# Patient Record
Sex: Female | Born: 1950
Health system: Southern US, Community
[De-identification: ages and names within clinical notes are randomized; demographics above are authoritative.]

## PROBLEM LIST (undated history)

## (undated) DIAGNOSIS — I10 Essential (primary) hypertension: Secondary | ICD-10-CM

## (undated) DIAGNOSIS — J45909 Unspecified asthma, uncomplicated: Secondary | ICD-10-CM

---

## 2020-04-18 ENCOUNTER — Inpatient Hospital Stay (HOSPITAL_COMMUNITY)
Admission: EM | Admit: 2020-04-18 | Discharge: 2020-04-25 | DRG: 177 | Disposition: A | Discharge status: Home / Self Care | Payer: HRSA Program | Attending: Internal Medicine | Admitting: Internal Medicine

## 2020-04-18 ENCOUNTER — Emergency Department (HOSPITAL_COMMUNITY): Payer: HRSA Program

## 2020-04-18 ENCOUNTER — Other Ambulatory Visit: Payer: Self-pay

## 2020-04-18 ENCOUNTER — Encounter (HOSPITAL_COMMUNITY): Payer: Self-pay

## 2020-04-18 DIAGNOSIS — I82412 Acute embolism and thrombosis of left femoral vein: Secondary | ICD-10-CM | POA: Diagnosis present

## 2020-04-18 DIAGNOSIS — U071 COVID-19: Principal | ICD-10-CM | POA: Diagnosis present

## 2020-04-18 DIAGNOSIS — I110 Hypertensive heart disease with heart failure: Secondary | ICD-10-CM | POA: Diagnosis present

## 2020-04-18 DIAGNOSIS — J9602 Acute respiratory failure with hypercapnia: Secondary | ICD-10-CM | POA: Diagnosis present

## 2020-04-18 DIAGNOSIS — J1282 Pneumonia due to coronavirus disease 2019: Secondary | ICD-10-CM | POA: Diagnosis present

## 2020-04-18 DIAGNOSIS — N170 Acute kidney failure with tubular necrosis: Secondary | ICD-10-CM | POA: Diagnosis present

## 2020-04-18 DIAGNOSIS — J45909 Unspecified asthma, uncomplicated: Secondary | ICD-10-CM | POA: Diagnosis present

## 2020-04-18 DIAGNOSIS — I82432 Acute embolism and thrombosis of left popliteal vein: Secondary | ICD-10-CM | POA: Diagnosis present

## 2020-04-18 DIAGNOSIS — J9601 Acute respiratory failure with hypoxia: Secondary | ICD-10-CM | POA: Diagnosis present

## 2020-04-18 DIAGNOSIS — I248 Other forms of acute ischemic heart disease: Secondary | ICD-10-CM | POA: Diagnosis present

## 2020-04-18 DIAGNOSIS — E1165 Type 2 diabetes mellitus with hyperglycemia: Secondary | ICD-10-CM | POA: Diagnosis present

## 2020-04-18 DIAGNOSIS — G9341 Metabolic encephalopathy: Secondary | ICD-10-CM | POA: Diagnosis present

## 2020-04-18 DIAGNOSIS — I82442 Acute embolism and thrombosis of left tibial vein: Secondary | ICD-10-CM | POA: Diagnosis present

## 2020-04-18 DIAGNOSIS — R0902 Hypoxemia: Secondary | ICD-10-CM

## 2020-04-18 DIAGNOSIS — Z6841 Body Mass Index (BMI) 40.0 and over, adult: Secondary | ICD-10-CM

## 2020-04-18 DIAGNOSIS — E785 Hyperlipidemia, unspecified: Secondary | ICD-10-CM | POA: Diagnosis present

## 2020-04-18 DIAGNOSIS — E662 Morbid (severe) obesity with alveolar hypoventilation: Secondary | ICD-10-CM | POA: Diagnosis present

## 2020-04-18 DIAGNOSIS — I5043 Acute on chronic combined systolic (congestive) and diastolic (congestive) heart failure: Secondary | ICD-10-CM | POA: Diagnosis present

## 2020-04-18 DIAGNOSIS — I82452 Acute embolism and thrombosis of left peroneal vein: Secondary | ICD-10-CM | POA: Diagnosis present

## 2020-04-18 DIAGNOSIS — J9600 Acute respiratory failure, unspecified whether with hypoxia or hypercapnia: Secondary | ICD-10-CM | POA: Diagnosis present

## 2020-04-18 DIAGNOSIS — J9690 Respiratory failure, unspecified, unspecified whether with hypoxia or hypercapnia: Secondary | ICD-10-CM | POA: Diagnosis present

## 2020-04-18 HISTORY — DX: Essential (primary) hypertension: I10

## 2020-04-18 HISTORY — DX: Unspecified asthma, uncomplicated: J45.909

## 2020-04-18 LAB — CBC WITH DIFFERENTIAL/PLATELET
Abs Immature Granulocytes: 0.12 10*3/uL — ABNORMAL HIGH (ref 0.00–0.07)
Basophils Absolute: 0 10*3/uL (ref 0.0–0.1)
Basophils Relative: 1 %
Eosinophils Absolute: 0 10*3/uL (ref 0.0–0.5)
Eosinophils Relative: 0 %
HCT: 48 % — ABNORMAL HIGH (ref 36.0–46.0)
Hemoglobin: 14.2 g/dL (ref 12.0–15.0)
Immature Granulocytes: 2 %
Lymphocytes Relative: 25 %
Lymphs Abs: 2 10*3/uL (ref 0.7–4.0)
MCH: 23.1 pg — ABNORMAL LOW (ref 26.0–34.0)
MCHC: 29.6 g/dL — ABNORMAL LOW (ref 30.0–36.0)
MCV: 77.9 fL — ABNORMAL LOW (ref 80.0–100.0)
Monocytes Absolute: 0.6 10*3/uL (ref 0.1–1.0)
Monocytes Relative: 7 %
Neutro Abs: 5.3 10*3/uL (ref 1.7–7.7)
Neutrophils Relative %: 65 %
Platelets: 238 10*3/uL (ref 150–400)
RBC: 6.16 MIL/uL — ABNORMAL HIGH (ref 3.87–5.11)
RDW: 17.2 % — ABNORMAL HIGH (ref 11.5–15.5)
WBC: 8.1 10*3/uL (ref 4.0–10.5)
nRBC: 0.2 % (ref 0.0–0.2)

## 2020-04-18 MED ORDER — SODIUM CHLORIDE 0.9 % IV SOLN: 500.0000 mg | Freq: Once | INTRAVENOUS | Status: DC

## 2020-04-18 NOTE — ED Triage Notes (Signed)
Patient from home with [facility], daughter called for shortness of breath, she gave patient 3 nebs at home with no relief. Patient found to be 67% on RA, now on 15L NRB sat 100%. Report she is vaccinated.

## 2020-04-18 NOTE — ED Provider Notes (Signed)
[facility] EMERGENCY DEPARTMENT Provider Note   CSN: [id number] Arrival date & time: 04/18/20  2253     History Chief Complaint  Patient presents with  . Shortness of Breath    Chelsea Norton is a 70 y.o. female.  70 year old female with medical problems documented below who presents to the emergency department today secondary to dyspnea.  Patient states she has been feeling bad for about a week or so.  She had fever, cough, malaise and just generally feeling unwell.  She was vaccinated against COVID with her second shot being in August 2021.  No known sick contacts.  Did not get booster.  Objective fevers at home.  Apparently was 67% on room air is more comfortable with her nonrebreather on.   Shortness of Breath      Past Medical History:  Diagnosis Date  . Asthma   . Hypertension     There are no problems to display for this patient.    OB History   No obstetric history on file.     No family history on file.     Home Medications Prior to Admission medications   Not on File    Allergies    Patient has no known allergies.  Review of Systems   Review of Systems  Respiratory: Positive for shortness of breath.   All other systems reviewed and are negative.   Physical Exam Updated Vital Signs BP (!) 202/106 (BP Location: Left Arm)   Pulse (!) 122   Temp (!) 102.6 F (39.2 C) (Oral)   Resp (!) 32   Ht 5\' 4"  (1.626 m)   SpO2 100%   Physical Exam Vitals and nursing note reviewed.  Constitutional:      Appearance: She is well-developed and well-nourished.  HENT:     Head: Normocephalic and atraumatic.     Mouth/Throat:     Mouth: Mucous membranes are moist.  Cardiovascular:     Rate and Rhythm: Regular rhythm. Tachycardia present.  Pulmonary:     Effort: Tachypnea present. No respiratory distress.     Breath sounds: No stridor. Decreased breath sounds present. No wheezing or rhonchi.  Abdominal:     General: There is no  distension.     Palpations: Abdomen is soft.  Musculoskeletal:     Cervical back: Normal range of motion.     Right lower leg: Edema present.     Left lower leg: Edema present.  Skin:    General: Skin is warm and dry.     Coloration: Skin is not jaundiced or pale.  Neurological:     General: No focal deficit present.     Mental Status: She is alert.     ED Results / Procedures / Treatments   Labs (all labs ordered are listed, but only abnormal results are displayed) Labs Reviewed  CULTURE, BLOOD (ROUTINE X 2)  CULTURE, BLOOD (ROUTINE X 2)  RESP PANEL BY RT-PCR (FLU A&B, COVID) ARPGX2  COMPREHENSIVE METABOLIC PANEL  LACTIC ACID, PLASMA  LACTIC ACID, PLASMA  CBC WITH DIFFERENTIAL/PLATELET  PROTIME-INR  URINALYSIS, ROUTINE W REFLEX MICROSCOPIC  BRAIN NATRIURETIC PEPTIDE  TROPONIN I (HIGH SENSITIVITY)    EKG None  Radiology DG Chest 2 View  Result Date: 04/18/2020 CLINICAL DATA:  Shortness of breath. EXAM: CHEST - 2 VIEW COMPARISON:  None. FINDINGS: Moderate to marked severity patchy infiltrates are seen within the bilateral lung bases and mid lung fields, bilaterally. There is no evidence of a pleural effusion or  pneumothorax. The cardiac silhouette is markedly enlarged. Widening of the mediastinum is also seen which may be positional in nature. Degenerative changes are seen throughout the thoracic spine. IMPRESSION: 1. Cardiomegaly with moderate to marked severity bilateral infiltrates. Electronically Signed   By: Aram Candela M.D.   On: 04/18/2020 23:32    Procedures .Critical Care Performed by: Marily Memos, MD Authorized by: Marily Memos, MD   Critical care provider statement:    Critical care time (minutes):  45   Critical care was necessary to treat or prevent imminent or life-threatening deterioration of the following conditions:  Respiratory failure   Critical care was time spent personally by me on the following activities:  Discussions with consultants,  evaluation of patient's response to treatment, examination of patient, ordering and performing treatments and interventions, ordering and review of laboratory studies, ordering and review of radiographic studies, pulse oximetry, re-evaluation of patient's condition, obtaining history from patient or surrogate and review of old charts   (including critical care time)  Medications Ordered in ED Medications  azithromycin (ZITHROMAX) 500 mg in sodium chloride 0.9 % 250 mL IVPB (has no administration in time range)    ED Course  I have reviewed the triage vital signs and the nursing notes.  Pertinent labs & imaging results that were available during my care of the patient were reviewed by me and considered in my medical decision making (see chart for details).    MDM Rules/Calculators/A&P                         Patient is febrile tachycardic tachypneic with a significant oxygen requirement.  She does not seem to be consistent with acute pulmonary edema even though her blood pressure is pretty high.  Suspect this is going to be multifocal pneumonia either related to COVID or bacterial, atypical.  We will go and start some antibiotics just in case.  Pending COVID test at this time.  Obviously need to be admitted but depending on her oxygen requirement for level of care. Patient initially had been doing pretty well on a nonrebreather so we had plan for admission to medicine team.  However patient subsequently had a rapid decline prior to being transported to the floor she became much more somnolent.  It was suspected that may be she is always a CO2 retainer and by having on a nonrebreather she lost some of her respiratory drive.  Her blood gas was concerning for respiratory acidosis (greater than 120 CO) so started on BiPAP even though she is known to have COVID.  This was used with a closed circuit.  Considered intubation however she was still slightly responsive and intubation is very detrimental for  COVID patients the patient was observed on BiPAP for about an hour really checked multiple times throughout that and she progressively improving mental status.  Her blood gas improved significantly so as well.  At this point decided to call critical care for admission as she is in need to be on BiPAP for little bit longer and could decompensate again.  At this time does not need intubation.  Final Clinical Impression(s) / ED Diagnoses Final diagnoses:  Hypoxia  Acute respiratory failure with hypoxia [employer])    Rx / DC Orders ED Discharge Orders    None       Nekhi Liwanag, Barbara Cower, MD 04/20/20 [phone]

## 2020-04-19 ENCOUNTER — Inpatient Hospital Stay (HOSPITAL_COMMUNITY): Payer: HRSA Program

## 2020-04-19 DIAGNOSIS — I5043 Acute on chronic combined systolic (congestive) and diastolic (congestive) heart failure: Secondary | ICD-10-CM | POA: Diagnosis present

## 2020-04-19 DIAGNOSIS — N179 Acute kidney failure, unspecified: Secondary | ICD-10-CM

## 2020-04-19 DIAGNOSIS — N170 Acute kidney failure with tubular necrosis: Secondary | ICD-10-CM | POA: Diagnosis present

## 2020-04-19 DIAGNOSIS — J9602 Acute respiratory failure with hypercapnia: Secondary | ICD-10-CM | POA: Diagnosis present

## 2020-04-19 DIAGNOSIS — J9690 Respiratory failure, unspecified, unspecified whether with hypoxia or hypercapnia: Secondary | ICD-10-CM | POA: Diagnosis present

## 2020-04-19 DIAGNOSIS — I82412 Acute embolism and thrombosis of left femoral vein: Secondary | ICD-10-CM | POA: Diagnosis present

## 2020-04-19 DIAGNOSIS — E1165 Type 2 diabetes mellitus with hyperglycemia: Secondary | ICD-10-CM | POA: Diagnosis present

## 2020-04-19 DIAGNOSIS — G9341 Metabolic encephalopathy: Secondary | ICD-10-CM | POA: Diagnosis present

## 2020-04-19 DIAGNOSIS — J1282 Pneumonia due to coronavirus disease 2019: Secondary | ICD-10-CM | POA: Diagnosis present

## 2020-04-19 DIAGNOSIS — I82432 Acute embolism and thrombosis of left popliteal vein: Secondary | ICD-10-CM | POA: Diagnosis present

## 2020-04-19 DIAGNOSIS — I82452 Acute embolism and thrombosis of left peroneal vein: Secondary | ICD-10-CM | POA: Diagnosis present

## 2020-04-19 DIAGNOSIS — R7989 Other specified abnormal findings of blood chemistry: Secondary | ICD-10-CM

## 2020-04-19 DIAGNOSIS — Z6841 Body Mass Index (BMI) 40.0 and over, adult: Secondary | ICD-10-CM | POA: Diagnosis not present

## 2020-04-19 DIAGNOSIS — J9600 Acute respiratory failure, unspecified whether with hypoxia or hypercapnia: Secondary | ICD-10-CM | POA: Diagnosis present

## 2020-04-19 DIAGNOSIS — U071 Acute respiratory failure, unspecified whether with hypoxia or hypercapnia: Secondary | ICD-10-CM | POA: Diagnosis present

## 2020-04-19 DIAGNOSIS — E662 Morbid (severe) obesity with alveolar hypoventilation: Secondary | ICD-10-CM | POA: Diagnosis present

## 2020-04-19 DIAGNOSIS — E785 Hyperlipidemia, unspecified: Secondary | ICD-10-CM | POA: Diagnosis present

## 2020-04-19 DIAGNOSIS — I5023 Acute on chronic systolic (congestive) heart failure: Secondary | ICD-10-CM

## 2020-04-19 DIAGNOSIS — I248 Other forms of acute ischemic heart disease: Secondary | ICD-10-CM | POA: Diagnosis present

## 2020-04-19 DIAGNOSIS — I82442 Acute embolism and thrombosis of left tibial vein: Secondary | ICD-10-CM | POA: Diagnosis present

## 2020-04-19 DIAGNOSIS — I110 Hypertensive heart disease with heart failure: Secondary | ICD-10-CM | POA: Diagnosis present

## 2020-04-19 DIAGNOSIS — J9601 Acute respiratory failure with hypoxia: Secondary | ICD-10-CM | POA: Diagnosis present

## 2020-04-19 DIAGNOSIS — J45909 Unspecified asthma, uncomplicated: Secondary | ICD-10-CM | POA: Diagnosis present

## 2020-04-19 LAB — COMPREHENSIVE METABOLIC PANEL
ALT: 41 U/L (ref 0–44)
AST: 46 U/L — ABNORMAL HIGH (ref 15–41)
Albumin: 3.2 g/dL — ABNORMAL LOW (ref 3.5–5.0)
Alkaline Phosphatase: 74 U/L (ref 38–126)
Anion gap: 12 (ref 5–15)
BUN: 15 mg/dL (ref 8–23)
CO2: 31 mmol/L (ref 22–32)
Calcium: 9.2 mg/dL (ref 8.9–10.3)
Chloride: 99 mmol/L (ref 98–111)
Creatinine, Ser: 1.29 mg/dL — ABNORMAL HIGH (ref 0.44–1.00)
GFR, Estimated: 45 mL/min — ABNORMAL LOW (ref >60)
Glucose, Bld: 265 mg/dL — ABNORMAL HIGH (ref 70–99)
Potassium: 3.7 mmol/L (ref 3.5–5.1)
Sodium: 142 mmol/L (ref 135–145)
Total Bilirubin: 0.7 mg/dL (ref 0.3–1.2)
Total Protein: 7.5 g/dL (ref 6.5–8.1)

## 2020-04-19 LAB — POCT I-STAT 7, (LYTES, BLD GAS, ICA,H+H)
Acid-Base Excess: 7 mmol/L — ABNORMAL HIGH (ref 0.0–2.0)
Bicarbonate: 37.9 mmol/L — ABNORMAL HIGH (ref 20.0–28.0)
Calcium, Ion: 1.24 mmol/L (ref 1.15–1.40)
HCT: 46 % (ref 36.0–46.0)
Hemoglobin: 15.6 g/dL — ABNORMAL HIGH (ref 12.0–15.0)
O2 Saturation: 97 %
Potassium: 4.5 mmol/L (ref 3.5–5.1)
Sodium: 141 mmol/L (ref 135–145)
TCO2: 40 mmol/L — ABNORMAL HIGH (ref 22–32)
pCO2 arterial: 81.3 mmHg (ref 32.0–48.0)
pH, Arterial: 7.276 — ABNORMAL LOW (ref 7.350–7.450)
pO2, Arterial: 103 mmHg (ref 83.0–108.0)

## 2020-04-19 LAB — LACTIC ACID, PLASMA
Lactic Acid, Venous: 1.1 mmol/L (ref 0.5–1.9)
Lactic Acid, Venous: 0.9 mmol/L (ref 0.5–1.9)

## 2020-04-19 LAB — ECHOCARDIOGRAM COMPLETE
Area-P 1/2: 3.03 cm2
Height: 64 in
S' Lateral: 2.4 cm
Weight: 3961.23 oz

## 2020-04-19 LAB — BLOOD GAS, ARTERIAL
Acid-Base Excess: 7 mmol/L — ABNORMAL HIGH (ref 0.0–2.0)
Acid-Base Excess: 5.2 mmol/L — ABNORMAL HIGH (ref 0.0–2.0)
Bicarbonate: 36.6 mmol/L — ABNORMAL HIGH (ref 20.0–28.0)
Bicarbonate: 33 mmol/L — ABNORMAL HIGH (ref 20.0–28.0)
Drawn by: 34719
Drawn by: 34719
FIO2: 100
FIO2: 60
O2 Saturation: 96.6 %
O2 Saturation: 89.8 %
Patient temperature: 37.5
Patient temperature: 36.6
pCO2 arterial: >120 mmHg (ref 32.0–48.0)
pCO2 arterial: 88.3 mmHg (ref 32.0–48.0)
pH, Arterial: 7.085 — CL (ref 7.350–7.450)
pH, Arterial: 7.195 — CL (ref 7.350–7.450)
pO2, Arterial: 126 mmHg — ABNORMAL HIGH (ref 83.0–108.0)
pO2, Arterial: 68.1 mmHg — ABNORMAL LOW (ref 83.0–108.0)

## 2020-04-19 LAB — RESP PANEL BY RT-PCR (FLU A&B, COVID) ARPGX2
Influenza A by PCR: NEGATIVE
Influenza B by PCR: NEGATIVE
SARS Coronavirus 2 by RT PCR: POSITIVE — AB

## 2020-04-19 LAB — FERRITIN: Ferritin: 56 ng/mL (ref 11–307)

## 2020-04-19 LAB — PROCALCITONIN: Procalcitonin: <0.1 ng/mL

## 2020-04-19 LAB — FIBRINOGEN: Fibrinogen: 476 mg/dL — ABNORMAL HIGH (ref 210–475)

## 2020-04-19 LAB — D-DIMER, QUANTITATIVE: D-Dimer, Quant: 3.72 ug/mL-FEU — ABNORMAL HIGH (ref 0.00–0.50)

## 2020-04-19 LAB — GLUCOSE, CAPILLARY
Glucose-Capillary: 279 mg/dL — ABNORMAL HIGH (ref 70–99)
Glucose-Capillary: 303 mg/dL — ABNORMAL HIGH (ref 70–99)
Glucose-Capillary: 250 mg/dL — ABNORMAL HIGH (ref 70–99)
Glucose-Capillary: 200 mg/dL — ABNORMAL HIGH (ref 70–99)
Glucose-Capillary: 188 mg/dL — ABNORMAL HIGH (ref 70–99)
Glucose-Capillary: 134 mg/dL — ABNORMAL HIGH (ref 70–99)
Glucose-Capillary: 111 mg/dL — ABNORMAL HIGH (ref 70–99)
Glucose-Capillary: 109 mg/dL — ABNORMAL HIGH (ref 70–99)
Glucose-Capillary: 108 mg/dL — ABNORMAL HIGH (ref 70–99)
Glucose-Capillary: 118 mg/dL — ABNORMAL HIGH (ref 70–99)
Glucose-Capillary: 105 mg/dL — ABNORMAL HIGH (ref 70–99)

## 2020-04-19 LAB — TROPONIN I (HIGH SENSITIVITY)
Troponin I (High Sensitivity): 410 ng/L (ref <18)
Troponin I (High Sensitivity): 205 ng/L (ref <18)

## 2020-04-19 LAB — HIV ANTIBODY (ROUTINE TESTING W REFLEX): HIV Screen 4th Generation wRfx: NONREACTIVE

## 2020-04-19 LAB — BRAIN NATRIURETIC PEPTIDE: B Natriuretic Peptide: 306.9 pg/mL — ABNORMAL HIGH (ref 0.0–100.0)

## 2020-04-19 LAB — PROTIME-INR
INR: 1.1 (ref 0.8–1.2)
Prothrombin Time: 14.1 seconds (ref 11.4–15.2)

## 2020-04-19 LAB — C-REACTIVE PROTEIN: CRP: 6.2 mg/dL — ABNORMAL HIGH (ref <1.0)

## 2020-04-19 LAB — MRSA PCR SCREENING: MRSA by PCR: NEGATIVE

## 2020-04-19 LAB — TRIGLYCERIDES: Triglycerides: 119 mg/dL (ref <150)

## 2020-04-19 LAB — LACTATE DEHYDROGENASE: LDH: 672 U/L — ABNORMAL HIGH (ref 98–192)

## 2020-04-19 MED ORDER — SUCCINYLCHOLINE CHLORIDE 200 MG/10ML IV SOSY
PREFILLED_SYRINGE | INTRAVENOUS | Status: AC
  Filled 2020-04-19: qty 10

## 2020-04-19 MED ORDER — POLYETHYLENE GLYCOL 3350 17 G PO PACK: 17.0000 g | PACK | Freq: Every day | ORAL | Status: DC | PRN

## 2020-04-19 MED ORDER — CHLORHEXIDINE GLUCONATE CLOTH 2 % EX PADS
6.0000 | MEDICATED_PAD | Freq: Every day | CUTANEOUS | Status: DC
  Administered 2020-04-19 – 2020-04-25 (×6): 6 via TOPICAL

## 2020-04-19 MED ORDER — ORAL CARE MOUTH RINSE
15.0000 mL | Freq: Two times a day (BID) | OROMUCOSAL | Status: DC
  Administered 2020-04-19 – 2020-04-25 (×11): 15 mL via OROMUCOSAL

## 2020-04-19 MED ORDER — DEXAMETHASONE SODIUM PHOSPHATE 10 MG/ML IJ SOLN
6.0000 mg | INTRAMUSCULAR | Status: DC
  Administered 2020-04-20 – 2020-04-25 (×6): 6 mg via INTRAVENOUS
  Filled 2020-04-19 (×6): qty 1

## 2020-04-19 MED ORDER — DEXTROSE 50 % IV SOLN: 0.0000 mL | INTRAVENOUS | Status: DC | PRN

## 2020-04-19 MED ORDER — SODIUM CHLORIDE 0.9 % IV SOLN
200.0000 mg | Freq: Once | INTRAVENOUS | Status: AC
  Administered 2020-04-19: 200 mg via INTRAVENOUS
  Filled 2020-04-19: qty 40

## 2020-04-19 MED ORDER — FUROSEMIDE 10 MG/ML IJ SOLN
40.0000 mg | Freq: Once | INTRAMUSCULAR | Status: AC
  Administered 2020-04-19: 40 mg via INTRAVENOUS
  Filled 2020-04-19: qty 4

## 2020-04-19 MED ORDER — INSULIN REGULAR(HUMAN) IN NACL 100-0.9 UT/100ML-% IV SOLN
INTRAVENOUS | Status: DC
  Administered 2020-04-19: 13 [IU]/h via INTRAVENOUS
  Filled 2020-04-19: qty 100

## 2020-04-19 MED ORDER — INSULIN ASPART 100 UNIT/ML ~~LOC~~ SOLN
3.0000 [IU] | SUBCUTANEOUS | Status: DC
  Administered 2020-04-20: 3 [IU] via SUBCUTANEOUS
  Administered 2020-04-20: 9 [IU] via SUBCUTANEOUS
  Administered 2020-04-20 (×3): 6 [IU] via SUBCUTANEOUS
  Administered 2020-04-21: 9 [IU] via SUBCUTANEOUS

## 2020-04-19 MED ORDER — APIXABAN 5 MG PO TABS: 5.0000 mg | ORAL_TABLET | Freq: Two times a day (BID) | ORAL | Status: DC

## 2020-04-19 MED ORDER — SODIUM CHLORIDE 0.9 % IV SOLN
100.0000 mg | Freq: Every day | INTRAVENOUS | Status: AC
  Administered 2020-04-20 – 2020-04-23 (×4): 100 mg via INTRAVENOUS
  Filled 2020-04-19 (×4): qty 20

## 2020-04-19 MED ORDER — ROCURONIUM BROMIDE 10 MG/ML (PF) SYRINGE
PREFILLED_SYRINGE | INTRAVENOUS | Status: AC
  Filled 2020-04-19: qty 10

## 2020-04-19 MED ORDER — INSULIN ASPART 100 UNIT/ML ~~LOC~~ SOLN
3.0000 [IU] | SUBCUTANEOUS | Status: DC
  Administered 2020-04-19: 9 [IU] via SUBCUTANEOUS

## 2020-04-19 MED ORDER — METHYLPREDNISOLONE SODIUM SUCC 125 MG IJ SOLR
50.0000 mg | Freq: Once | INTRAMUSCULAR | Status: AC
  Administered 2020-04-19: 50 mg via INTRAVENOUS
  Filled 2020-04-19: qty 2

## 2020-04-19 MED ORDER — APIXABAN 5 MG PO TABS
10.0000 mg | ORAL_TABLET | Freq: Two times a day (BID) | ORAL | Status: DC
  Administered 2020-04-19 – 2020-04-25 (×13): 10 mg via ORAL
  Filled 2020-04-19 (×13): qty 2

## 2020-04-19 MED ORDER — INSULIN DETEMIR 100 UNIT/ML ~~LOC~~ SOLN
5.0000 [IU] | Freq: Two times a day (BID) | SUBCUTANEOUS | Status: DC
  Administered 2020-04-19 – 2020-04-20 (×2): 5 [IU] via SUBCUTANEOUS
  Filled 2020-04-19 (×3): qty 0.05

## 2020-04-19 MED ORDER — CHLORHEXIDINE GLUCONATE 0.12 % MT SOLN
15.0000 mL | Freq: Two times a day (BID) | OROMUCOSAL | Status: DC
  Administered 2020-04-19 – 2020-04-25 (×13): 15 mL via OROMUCOSAL
  Filled 2020-04-19 (×11): qty 15

## 2020-04-19 MED ORDER — HEPARIN SODIUM (PORCINE) 5000 UNIT/ML IJ SOLN: 7500.0000 [IU] | Freq: Three times a day (TID) | INTRAMUSCULAR | Status: DC

## 2020-04-19 NOTE — Progress Notes (Signed)
CRITICAL VALUE ALERT  Critical Value:  Troponin 413  Date & Time Notied:  04/19/20 0800  Provider Notified: Dr. Merrily Pew  Orders Received/Actions taken:   MD Aware; No new orders given at this time

## 2020-04-19 NOTE — Progress Notes (Signed)
Bilateral lower extremity venous study completed.   Results given to RN   Please see CV Proc for preliminary results.   Dalvin Clipper, RVT  

## 2020-04-19 NOTE — H&P (Signed)
NAMELetisha Norton, MRN:  [id number], DOB:  November 04, 1950, LOS: 0 ADMISSION DATE:  04/18/2020, CONSULTATION DATE:  04/19/2020 REFERRING MD:  Dr. Clayborne Dana, CHIEF COMPLAINT:  Respiratory Failure    History of Present Illness:  70 year old obese female presents to ED on 1/19 with progressive shortness of breath and hypoxia. On arrival to ED patient is alert and oriented. Reports feeling unwell for last week with fever, cough, and weakness. Reports fully vaccinated with last vaccine 10/2019. Oxygen Saturation 67% on room air, placed on NRB with improvement. While in ED patient with noted to be obtunded with tachypnea. Placed on BiPAP. ABG 7.085/>120/126. CXR with Cardiomegaly with bilateral infiltrates   Past Medical History:  Asthma, HLD   Significant Hospital Events:  1/20 > Presents to ED   Consults:  PCCM  Procedures:    Significant Diagnostic Tests:  CXR 1/19 > Cardiomegaly with moderate to marked severity bilateral Infiltrates CT Head 1/20 >>   Micro Data:  Blood 1/20 >> U/A 1/20 >>  Antimicrobials:     Interim History / Subjective:  As above.   Objective   Blood pressure (!) 97/58, pulse 87, temperature 97.9 F (36.6 C), temperature source Oral, resp. rate (!) 25, height 5\' 4"  (1.626 m), SpO2 94 %.    Vent Mode: PCV;BIPAP FiO2 (%):  [60 %] 60 % Set Rate:  [25 bmp] 25 bmp PEEP:  [5 cmH20] 5 cmH20 Plateau Pressure:  [11 cmH20] 11 cmH20  No intake or output data in the 24 hours ending 04/19/20 0520 There were no vitals filed for this visit.  Examination: General: Obese adult female, lying in bed  HENT: BiPAP in place  Lungs: Diminished, no crackles, mild tachypnea  Cardiovascular: RRR, HR 90 Abdomen: obese, active bowel sounds  Extremities: +2 BLE edema  Neuro: lethargic, opens eyes with verbal stimulation, left gaze preference, withdrawals in all extremities  GU: intact   Resolved Hospital Problem list     Assessment & Plan:   Encephalopathy presumed  secondary to hypercarbic state Plan  -Treatment as below -Obtain CT Head   Acute Hypoxic/Hypercarbic Respiratory Failure secondary to COVID-19 Plan  -Continue BiPAP, Trend ABG  -Titrate Supplemental Oxygen for Saturation Goal >90 -Continue Decadron and Remdisivir -U/S LE to R/O DVT due to hypercoagulable state   Cardiomegaly  -Appears volume overload  Plan  -Lasix 40 mg  -ECHO   Acute Renal Injury, unsure baseline kidney function, if acute secondary to ATN due to hypoperfusion/hypoxic  Plan -Trend BMP -Replace electrolytes as indicated   Best practice (evaluated daily)  Diet: NPO DVT prophylaxis: Heparin sq  GI prophylaxis: PPI Glucose control: Trend Glucose, SSI  Mobility: bedrest  Disposition:admit to ICU   Goals of Care:  Last date of multidisciplinary goals of care discussion: Family and staff present:  Summary of discussion:  Follow up goals of care discussion due:  Code Status: Full Code. Attempted to call daughter, no answer   Labs   CBC: Recent Labs  Lab 04/18/20 2316  WBC 8.1  NEUTROABS 5.3  HGB 14.2  HCT 48.0*  MCV 77.9*  PLT 238    Basic Metabolic Panel: Recent Labs  Lab 04/18/20 2316  NA 142  K 3.7  CL 99  CO2 31  GLUCOSE 265*  BUN 15  CREATININE 1.29*  CALCIUM 9.2   GFR: CrCl cannot be calculated (Unknown ideal weight.). Recent Labs  Lab 04/18/20 2316 04/19/20 0225  WBC 8.1  --   LATICACIDVEN 1.1 0.9  Liver Function Tests: Recent Labs  Lab 04/18/20 2316  AST 46*  ALT 41  ALKPHOS 74  BILITOT 0.7  PROT 7.5  ALBUMIN 3.2*   No results for input(s): LIPASE, AMYLASE in the last 168 hours. No results for input(s): AMMONIA in the last 168 hours.  ABG    Component Value Date/Time   PHART 7.195 (LL) 04/19/2020 0356   PCO2ART 88.3 (HH) 04/19/2020 0356   PO2ART 68.1 (L) 04/19/2020 0356   HCO3 33.0 (H) 04/19/2020 0356   O2SAT 89.8 04/19/2020 0356     Coagulation Profile: Recent Labs  Lab 04/18/20 2316  INR 1.1     Cardiac Enzymes: No results for input(s): CKTOTAL, CKMB, CKMBINDEX, TROPONINI in the last 168 hours.  HbA1C: No results found for: HGBA1C  CBG: No results for input(s): GLUCAP in the last 168 hours.  Review of Systems:   Unable to review given encephalopathy   Past Medical History:  She,  has a past medical history of Asthma and Hypertension.   Surgical History:  Unknown    Social History:   unknown   Family History:  Her family history is not on file.   Allergies No Known Allergies   Home Medications  Prior to Admission medications   Not on File     Critical care time: 54 minutes     Jovita Kussmaul, AGACNP-BC [facility]  Pgr: [phone]  PCCM Pgr: [phone]

## 2020-04-19 NOTE — ED Notes (Signed)
[phone] pt daughter number her name is Geographical information systems officer

## 2020-04-19 NOTE — ED Notes (Signed)
Daughter Malachy Moan given update on patient

## 2020-04-19 NOTE — Progress Notes (Signed)
  Echocardiogram 2D Echocardiogram has been performed.  Dorena Dew Dance 04/19/2020, 12:02 PM

## 2020-04-19 NOTE — Progress Notes (Signed)
RT obtained ABG with the following results. MD Mesner notified of all critical values. No changes at this time. RT will continue to monitor.   Results for Chelsea Norton, Chelsea Norton (MRN [id number]) as of 04/19/2020 06:22  Ref. Range 04/19/2020 03:56  Sample type Unknown ARTERIAL  FIO2 Unknown 60.00  pH, Arterial Latest Ref Range: 7.350 - 7.450  7.195 (LL)  pCO2 arterial Latest Ref Range: 32.0 - 48.0 mmHg 88.3 (HH)  pO2, Arterial Latest Ref Range: 83.0 - 108.0 mmHg 68.1 (L)  Acid-Base Excess Latest Ref Range: 0.0 - 2.0 mmol/L 5.2 (H)  Bicarbonate Latest Ref Range: 20.0 - 28.0 mmol/L 33.0 (H)  O2 Saturation Latest Units: % 89.8  Patient temperature Unknown 36.6  Collection site Unknown LEFT BRACHIAL  Allens test (pass/fail) Latest Ref Range: PASS  BRACHIAL ARTERY (A)

## 2020-04-19 NOTE — Progress Notes (Signed)
RT obtained ABG on pt with the following results. Md Mesner notified of critical values on ABG. Pt lethargic at this time. Placed pt on Servo I in NIV/PC mode at this time. RT will continue to monitor.   Results for Chelsea Norton, Chelsea Norton (MRN [id number]) as of 04/19/2020 03:08  Ref. Range 04/19/2020 02:30  Sample type Unknown ARTERIAL  FIO2 Unknown 100.00  pH, Arterial Latest Ref Range: 7.350 - 7.450  7.085 (LL)  pCO2 arterial Latest Ref Range: 32.0 - 48.0 mmHg >120.0 (HH)  pO2, Arterial Latest Ref Range: 83.0 - 108.0 mmHg 126.0 (H)  Acid-Base Excess Latest Ref Range: 0.0 - 2.0 mmol/L 7.0 (H)  Bicarbonate Latest Ref Range: 20.0 - 28.0 mmol/L 36.6 (H)  O2 Saturation Latest Units: % 96.6  Patient temperature Unknown 37.5  Collection site Unknown RIGHT RADIAL  Allens test (pass/fail) Latest Ref Range: PASS  PASS

## 2020-04-19 NOTE — Progress Notes (Signed)
CBGs better controlled on insulin gtt Transition off using phase 3 protocol with 5 U leemir q 12 & resistant scle  Meridith Romick V. Vassie Loll MD

## 2020-04-19 NOTE — Progress Notes (Signed)
Took pt off bipap and placed pt on HHFNC as she is more awake and alert at this time. Pt denies SOB, no increased WOB. VS WNL.

## 2020-04-19 NOTE — ED Notes (Signed)
This RN went in to put pt back on cardiac monitor. Pt is more lethargic now and not responding as well as she was before. Pt breathing more labored. MD is aware. Resp to draw ABG.

## 2020-04-19 NOTE — Progress Notes (Signed)
CRITICAL VALUE ALERT  Critical Value:  L Leg positive for DVT  Date & Time Notied:  0920 04/19/20  Provider Notified: Dr. Merrily Pew  Orders Received/Actions taken:   No orders received at this time

## 2020-04-19 NOTE — ED Notes (Signed)
Patients sister also called to get an udpate if daughter is not able to be reached, [place], [phone]

## 2020-04-20 DIAGNOSIS — J9621 Acute and chronic respiratory failure with hypoxia: Secondary | ICD-10-CM

## 2020-04-20 DIAGNOSIS — J9622 Acute and chronic respiratory failure with hypercapnia: Secondary | ICD-10-CM

## 2020-04-20 DIAGNOSIS — J80 Acute respiratory distress syndrome: Secondary | ICD-10-CM

## 2020-04-20 LAB — CBC
HCT: 44.8 % (ref 36.0–46.0)
Hemoglobin: 13.2 g/dL (ref 12.0–15.0)
MCH: 23.2 pg — ABNORMAL LOW (ref 26.0–34.0)
MCHC: 29.5 g/dL — ABNORMAL LOW (ref 30.0–36.0)
MCV: 78.6 fL — ABNORMAL LOW (ref 80.0–100.0)
Platelets: 206 10*3/uL (ref 150–400)
RBC: 5.7 MIL/uL — ABNORMAL HIGH (ref 3.87–5.11)
RDW: 16.3 % — ABNORMAL HIGH (ref 11.5–15.5)
WBC: 8.6 10*3/uL (ref 4.0–10.5)
nRBC: 0 % (ref 0.0–0.2)

## 2020-04-20 LAB — BASIC METABOLIC PANEL
Anion gap: 12 (ref 5–15)
BUN: 32 mg/dL — ABNORMAL HIGH (ref 8–23)
CO2: 31 mmol/L (ref 22–32)
Calcium: 8.8 mg/dL — ABNORMAL LOW (ref 8.9–10.3)
Chloride: 100 mmol/L (ref 98–111)
Creatinine, Ser: 1.54 mg/dL — ABNORMAL HIGH (ref 0.44–1.00)
GFR, Estimated: 36 mL/min — ABNORMAL LOW (ref >60)
Glucose, Bld: 144 mg/dL — ABNORMAL HIGH (ref 70–99)
Potassium: 3.7 mmol/L (ref 3.5–5.1)
Sodium: 143 mmol/L (ref 135–145)

## 2020-04-20 LAB — GLUCOSE, CAPILLARY
Glucose-Capillary: 119 mg/dL — ABNORMAL HIGH (ref 70–99)
Glucose-Capillary: 124 mg/dL — ABNORMAL HIGH (ref 70–99)
Glucose-Capillary: 155 mg/dL — ABNORMAL HIGH (ref 70–99)
Glucose-Capillary: 183 mg/dL — ABNORMAL HIGH (ref 70–99)
Glucose-Capillary: 193 mg/dL — ABNORMAL HIGH (ref 70–99)
Glucose-Capillary: 239 mg/dL — ABNORMAL HIGH (ref 70–99)
Glucose-Capillary: 92 mg/dL (ref 70–99)

## 2020-04-20 LAB — HEMOGLOBIN A1C
Hgb A1c MFr Bld: 10.1 % — ABNORMAL HIGH (ref 4.8–5.6)
Mean Plasma Glucose: 243.17 mg/dL

## 2020-04-20 LAB — MAGNESIUM: Magnesium: 2 mg/dL (ref 1.7–2.4)

## 2020-04-20 LAB — PHOSPHORUS: Phosphorus: 5.1 mg/dL — ABNORMAL HIGH (ref 2.5–4.6)

## 2020-04-20 MED ORDER — INSULIN DETEMIR 100 UNIT/ML ~~LOC~~ SOLN
10.0000 [IU] | Freq: Two times a day (BID) | SUBCUTANEOUS | Status: DC
  Administered 2020-04-20 – 2020-04-21 (×2): 10 [IU] via SUBCUTANEOUS
  Filled 2020-04-20 (×3): qty 0.1

## 2020-04-20 MED ORDER — BARICITINIB 2 MG PO TABS
2.0000 mg | ORAL_TABLET | Freq: Every day | ORAL | Status: DC
  Administered 2020-04-20 – 2020-04-25 (×6): 2 mg via ORAL
  Filled 2020-04-20 (×2): qty 2
  Filled 2020-04-20: qty 1
  Filled 2020-04-20: qty 2
  Filled 2020-04-20: qty 1
  Filled 2020-04-20: qty 2

## 2020-04-20 NOTE — Progress Notes (Addendum)
NAMEPatric Norton, MRN:  [id number], DOB:  [birth year], LOS: 1 ADMISSION DATE:  04/18/2020, CONSULTATION DATE:  04/19/2020 REFERRING MD:  Dr. Clayborne Dana, CHIEF COMPLAINT:  Respiratory Failure    History of Present Illness:  70 year old obese female presents to ED on 1/19 with progressive shortness of breath and hypoxia. On arrival to ED patient is alert and oriented. Reports feeling unwell for last week with fever, cough, and weakness. Reports fully vaccinated with last vaccine 10/2019. Oxygen Saturation 67% on room air, placed on NRB with improvement. While in ED patient with noted to be obtunded with tachypnea. Placed on BiPAP. ABG 7.085/>120/126. CXR with Cardiomegaly with bilateral infiltrates   Past Medical History:  Asthma, HLD   Significant Hospital Events:  1/20 > Presents to ED, start steroids, hypercarbia improved with biPAP 1/21 stable, heated HFNC, altered off BiPAP, improved on BiPAP, d1 baricitinib   Consults:  PCCM  Procedures:    Significant Diagnostic Tests:  CXR 1/19 > Cardiomegaly with moderate to marked severity bilateral Infiltrates CT Head 1/20 >>   Micro Data:  Blood 1/20 >> U/A 1/20 >>  Antimicrobials:     Interim History / Subjective:  Off BiPAP all night . Sleepy this AM, back on BiPAP with improved mentation. Asking to eat, diet given. 80% heated HFNC.  Objective   Blood pressure 122/76, pulse 94, temperature (!) 97.2 F (36.2 C), temperature source Axillary, resp. rate (!) 24, height 5\' 4"  (1.626 m), weight 115 kg, SpO2 (!) 89 %.    Vent Mode: BIPAP FiO2 (%):  [60 %-80 %] 80 % Set Rate:  [30 bmp] 30 bmp PEEP:  [5 cmH20-8 cmH20] 8 cmH20   Intake/Output Summary (Last 24 hours) at 04/20/2020 1101 Last data filed at 04/20/2020 0700 Gross per 24 hour  Intake 32.68 ml  Output 950 ml  Net -917.32 ml   Filed Weights   04/19/20 0830 04/19/20 1000 04/20/20 0408  Weight: 112.3 kg 112.3 kg 115 kg    Examination: General: Obese adult female,  lying in bed  HENT: BiPAP in place  Lungs: Diminished, no crackles, mild tachypnea  Cardiovascular: RRR, no murumr Abdomen: obese, active bowel sounds  Extremities: +1 BLE edema  Neuro: lethargic, opens eyes with verbal stimulation, left gaze preference, withdrawals in all extremities  GU: intact   Resolved Hospital Problem list     Assessment & Plan:   Encephalopathy secondary to hypercarbic state -BiPAP PRN -CT head clear  Acute Hypoxic/Hypercarbic Respiratory Failure secondary to COVID-19: Suspect element of OSA/OHS chronically. Mild volume overload contributing. -Continue BiPAP PRN and at night, HFNC, Trend ABG as needed -Titrate Supplemental Oxygen for Saturation Goal >90 -Continue Decadron, start baricitinib  LLE DVT: on PVL 1/20. Provoked in setting of COVID. --Apixaban (load x 7 days then at least 3 months)  Volume overload due to acute diastolic heart failure -Lasix 40 mg 1/20 with good UOP, mild Cr bump 1/21 with poor PO intake - hold on further lasix for now -TTE G2 ddfxn, mildly dilated LA, mildly dilated RA, trivial MVR, mild TR, RA pressure 8  Acute Renal Injury: unsure baseline kidney function, if acute secondary to ATN due to hypoperfusion/hypoxic  -Trend BMP -Replace electrolytes as indicated   Demand ischemia: mild troponin elevation that has trended down.   Best practice (evaluated daily)  Diet: regular DVT prophylaxis: apixaben (LLE DVT)  GI prophylaxis: PPI Glucose control: Trend Glucose, SSI  Mobility: bedrest  Disposition:ICU  Goals of Care:  Last date of multidisciplinary  goals of care discussion: Family and staff present:  Summary of discussion:  Follow up goals of care discussion due:  Code Status: Full Code. Attempted to call daughter, no answer   Labs   CBC: Recent Labs  Lab 04/18/20 2316 04/19/20 0741 04/20/20 0319  WBC 8.1  --  8.6  NEUTROABS 5.3  --   --   HGB 14.2 15.6* 13.2  HCT 48.0* 46.0 44.8  MCV 77.9*  --  78.6*  PLT  238  --  206    Basic Metabolic Panel: Recent Labs  Lab 04/18/20 2316 04/19/20 0741 04/20/20 0319  NA 142 141 143  K 3.7 4.5 3.7  CL 99  --  100  CO2 31  --  31  GLUCOSE 265*  --  144*  BUN 15  --  32*  CREATININE 1.29*  --  1.54*  CALCIUM 9.2  --  8.8*  MG  --   --  2.0  PHOS  --   --  5.1*   GFR: Estimated Creatinine Clearance: 42.9 mL/min (A) (by C-G formula based on SCr of 1.54 mg/dL (H)). Recent Labs  Lab 04/18/20 2316 04/19/20 0225 04/19/20 0235 04/20/20 0319  PROCALCITON  --   --  <0.10  --   WBC 8.1  --   --  8.6  LATICACIDVEN 1.1 0.9  --   --     Liver Function Tests: Recent Labs  Lab 04/18/20 2316  AST 46*  ALT 41  ALKPHOS 74  BILITOT 0.7  PROT 7.5  ALBUMIN 3.2*   No results for input(s): LIPASE, AMYLASE in the last 168 hours. No results for input(s): AMMONIA in the last 168 hours.  ABG    Component Value Date/Time   PHART 7.276 (L) 04/19/2020 0741   PCO2ART 81.3 (HH) 04/19/2020 0741   PO2ART 103 04/19/2020 0741   HCO3 37.9 (H) 04/19/2020 0741   TCO2 40 (H) 04/19/2020 0741   O2SAT 97.0 04/19/2020 0741     Coagulation Profile: Recent Labs  Lab 04/18/20 2316  INR 1.1    Cardiac Enzymes: No results for input(s): CKTOTAL, CKMB, CKMBINDEX, TROPONINI in the last 168 hours.  HbA1C: Hgb A1c MFr Bld  Date/Time Value Ref Range Status  04/20/2020 03:19 AM 10.1 (H) 4.8 - 5.6 % Final    Comment:    (NOTE) Pre diabetes:          5.7%-6.4%  Diabetes:              >6.4%  Glycemic control for   <7.0% adults with diabetes     CBG: Recent Labs  Lab 04/19/20 2054 04/19/20 2236 04/20/20 0040 04/20/20 0322 04/20/20 0745  GLUCAP 118* 105* 119* 155* 124*    Review of Systems:   Unable to review given encephalopathy   Past Medical History:  She,  has a past medical history of Asthma and Hypertension.   Surgical History:  Unknown    Social History:   unknown   Family History:  Her family history is not on file.    Allergies No Known Allergies   Home Medications  Prior to Admission medications   Not on File     Critical care time:      CRITICAL CARE Performed by: Karren Burly   Total critical care time: 42 minutes  Critical care time was exclusive of separately billable procedures and treating other patients.  Critical care was necessary to treat or prevent imminent or life-threatening deterioration.  Critical care  was time spent personally by me on the following activities: development of treatment plan with patient and/or surrogate as well as nursing, discussions with consultants, evaluation of patient's response to treatment, examination of patient, obtaining history from patient or surrogate, ordering and performing treatments and interventions, ordering and review of laboratory studies, ordering and review of radiographic studies, pulse oximetry and re-evaluation of patient's condition.

## 2020-04-20 NOTE — Progress Notes (Signed)
Pt very sleepy so was placed on bipap at this time

## 2020-04-21 LAB — CBC
HCT: 45.2 % (ref 36.0–46.0)
Hemoglobin: 12.6 g/dL (ref 12.0–15.0)
MCH: 21.8 pg — ABNORMAL LOW (ref 26.0–34.0)
MCHC: 27.9 g/dL — ABNORMAL LOW (ref 30.0–36.0)
MCV: 78.1 fL — ABNORMAL LOW (ref 80.0–100.0)
Platelets: 224 10*3/uL (ref 150–400)
RBC: 5.79 MIL/uL — ABNORMAL HIGH (ref 3.87–5.11)
RDW: 15.9 % — ABNORMAL HIGH (ref 11.5–15.5)
WBC: 5.2 10*3/uL (ref 4.0–10.5)
nRBC: 0 % (ref 0.0–0.2)

## 2020-04-21 LAB — BASIC METABOLIC PANEL
Anion gap: 9 (ref 5–15)
BUN: 35 mg/dL — ABNORMAL HIGH (ref 8–23)
CO2: 30 mmol/L (ref 22–32)
Calcium: 8.5 mg/dL — ABNORMAL LOW (ref 8.9–10.3)
Chloride: 98 mmol/L (ref 98–111)
Creatinine, Ser: 1.26 mg/dL — ABNORMAL HIGH (ref 0.44–1.00)
GFR, Estimated: 46 mL/min — ABNORMAL LOW (ref >60)
Glucose, Bld: 347 mg/dL — ABNORMAL HIGH (ref 70–99)
Potassium: 4.4 mmol/L (ref 3.5–5.1)
Sodium: 137 mmol/L (ref 135–145)

## 2020-04-21 LAB — GLUCOSE, CAPILLARY
Glucose-Capillary: 106 mg/dL — ABNORMAL HIGH (ref 70–99)
Glucose-Capillary: 244 mg/dL — ABNORMAL HIGH (ref 70–99)
Glucose-Capillary: 328 mg/dL — ABNORMAL HIGH (ref 70–99)
Glucose-Capillary: 294 mg/dL — ABNORMAL HIGH (ref 70–99)

## 2020-04-21 MED ORDER — INSULIN ASPART 100 UNIT/ML ~~LOC~~ SOLN
0.0000 [IU] | Freq: Three times a day (TID) | SUBCUTANEOUS | Status: DC
  Administered 2020-04-21: 11 [IU] via SUBCUTANEOUS
  Administered 2020-04-22: 3 [IU] via SUBCUTANEOUS
  Administered 2020-04-22: 8 [IU] via SUBCUTANEOUS
  Administered 2020-04-22: 5 [IU] via SUBCUTANEOUS
  Administered 2020-04-23: 8 [IU] via SUBCUTANEOUS
  Administered 2020-04-23: 5 [IU] via SUBCUTANEOUS
  Administered 2020-04-24: 2 [IU] via SUBCUTANEOUS
  Administered 2020-04-24 (×2): 11 [IU] via SUBCUTANEOUS
  Administered 2020-04-25: 8 [IU] via SUBCUTANEOUS
  Administered 2020-04-25: 10 [IU] via SUBCUTANEOUS

## 2020-04-21 MED ORDER — INSULIN ASPART 100 UNIT/ML ~~LOC~~ SOLN
5.0000 [IU] | Freq: Three times a day (TID) | SUBCUTANEOUS | Status: DC
  Administered 2020-04-22 – 2020-04-25 (×11): 5 [IU] via SUBCUTANEOUS

## 2020-04-21 MED ORDER — INSULIN ASPART 100 UNIT/ML ~~LOC~~ SOLN
0.0000 [IU] | Freq: Every day | SUBCUTANEOUS | Status: DC
  Administered 2020-04-21: 3 [IU] via SUBCUTANEOUS
  Administered 2020-04-22 – 2020-04-24 (×2): 2 [IU] via SUBCUTANEOUS

## 2020-04-21 NOTE — Progress Notes (Signed)
NAMEAhnesty Norton, MRN:  [id number], DOB:  15-Oct-1950, LOS: 2 ADMISSION DATE:  04/18/2020, CONSULTATION DATE:  04/19/2020 REFERRING MD:  Dr. Clayborne Dana, CHIEF COMPLAINT:  Respiratory Failure    History of Present Illness:  70 year old obese female presents to ED on 1/19 with progressive shortness of breath and hypoxia. On arrival to ED patient is alert and oriented. Reports feeling unwell for last week with fever, cough, and weakness. Reports fully vaccinated with last vaccine 10/2019. Oxygen Saturation 67% on room air, placed on NRB with improvement. While in ED patient with noted to be obtunded with tachypnea. Placed on BiPAP. ABG 7.085/>120/126. CXR with Cardiomegaly with bilateral infiltrates   Past Medical History:  Asthma, HLD   Significant Hospital Events:  1/20 > Presents to ED, start steroids, hypercarbia improved with biPAP 1/21 stable, heated HFNC, altered off BiPAP, improved on BiPAP, d1 baricitinib   Consults:  PCCM  Procedures:    Significant Diagnostic Tests:  CXR 1/19 > Cardiomegaly with moderate to marked severity bilateral Infiltrates CT Head 1/20 >> Nl  Micro Data:  Blood 1/20 >> U/A 1/20 >>  Antimicrobials:     Interim History / Subjective:  Off BiPAP all night . FiO2 weaning on HFNC. Feeling better.   Objective   Blood pressure 103/69, pulse 87, temperature 97.7 F (36.5 C), temperature source Oral, resp. rate (!) 22, height 5\' 4"  (1.626 m), weight 115.5 kg, SpO2 95 %.    Vent Mode: BIPAP FiO2 (%):  [60 %-80 %] 60 % Set Rate:  [30 bmp] 30 bmp PEEP:  [5 cmH20] 5 cmH20   Intake/Output Summary (Last 24 hours) at 04/21/2020 1534 Last data filed at 04/21/2020 1200 Gross per 24 hour  Intake 460.06 ml  Output --  Net 460.06 ml   Filed Weights   04/19/20 1000 04/20/20 0408 04/21/20 0500  Weight: 112.3 kg 115 kg 115.5 kg    Examination: General: Obese adult female, lying in bed  HENT: BiPAP in place  Lungs: Diminished, no crackles, mild tachypnea   Cardiovascular: RRR, no murumr Abdomen: obese, active bowel sounds  Extremities: +1 BLE edema  Neuro: lethargic, opens eyes with verbal stimulation, left gaze preference, withdrawals in all extremities  GU: intact   Resolved Hospital Problem list     Assessment & Plan:   Encephalopathy secondary to hypercarbic state: Resolved. 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 Mild volume overload contributing. Improving. -Continue BiPAP PRN and at night, HFNC, Trend ABG as needed -Titrate Supplemental Oxygen for Saturation Goal >90 -Continue Decadron, start baricitinib  LLE DVT: on PVL 1/20. Provoked in setting of COVID. --Apixaban (load x 7 days then at least 3 months)  Volume overload due to acute diastolic heart failure -Lasix 40 mg 1/20 with good UOP, mild Cr bump 1/21 with poor PO intake - hold on further lasix for now as awaiting labs -TTE G2 ddfxn, mildly dilated LA, mildly dilated RA, trivial MVR, mild TR, RA pressure 8  Acute Renal Injury: unsure baseline kidney function, if acute secondary to ATN due to hypoperfusion/hypoxic. -Trend BMP -Replace electrolytes as indicated   Demand ischemia: mild troponin elevation that has trended down.   Best practice (evaluated daily)  Diet: regular DVT prophylaxis: apixaben (LLE DVT)  GI prophylaxis: PPI Glucose control: Trend Glucose, SSI  Mobility: bedrest  Disposition:ICU  Goals of Care:  Last date of multidisciplinary goals of care discussion: n/a Family and staff present:  Summary of discussion:  Follow up goals of care discussion due: 1/26 Code Status: Full Code.   Labs   CBC: Recent Labs  Lab 04/18/20 2316 04/19/20 0741 04/20/20 0319  WBC 8.1  --  8.6  NEUTROABS 5.3  --   --   HGB 14.2 15.6* 13.2  HCT 48.0* 46.0 44.8  MCV 77.9*  --  78.6*  PLT 238  --  206    Basic Metabolic Panel: Recent Labs  Lab 04/18/20 2316  04/19/20 0741 04/20/20 0319  NA 142 141 143  K 3.7 4.5 3.7  CL 99  --  100  CO2 31  --  31  GLUCOSE 265*  --  144*  BUN 15  --  32*  CREATININE 1.29*  --  1.54*  CALCIUM 9.2  --  8.8*  MG  --   --  2.0  PHOS  --   --  5.1*   GFR: Estimated Creatinine Clearance: 43 mL/min (A) (by C-G formula based on SCr of 1.54 mg/dL (H)). Recent Labs  Lab 04/18/20 2316 04/19/20 0225 04/19/20 0235 04/20/20 0319  PROCALCITON  --   --  <0.10  --   WBC 8.1  --   --  8.6  LATICACIDVEN 1.1 0.9  --   --     Liver Function Tests: Recent Labs  Lab 04/18/20 2316  AST 46*  ALT 41  ALKPHOS 74  BILITOT 0.7  PROT 7.5  ALBUMIN 3.2*   No results for input(s): LIPASE, AMYLASE in the last 168 hours. No results for input(s): AMMONIA in the last 168 hours.  ABG    Component Value Date/Time   PHART 7.276 (L) 04/19/2020 0741   PCO2ART 81.3 (HH) 04/19/2020 0741   PO2ART 103 04/19/2020 0741   HCO3 37.9 (H) 04/19/2020 0741   TCO2 40 (H) 04/19/2020 0741   O2SAT 97.0 04/19/2020 0741     Coagulation Profile: Recent Labs  Lab 04/18/20 2316  INR 1.1    Cardiac Enzymes: No results for input(s): CKTOTAL, CKMB, CKMBINDEX, TROPONINI in the last 168 hours.  HbA1C: Hgb A1c MFr Bld  Date/Time Value Ref Range Status  04/20/2020 03:19 AM 10.1 (H) 4.8 - 5.6 % Final    Comment:    (NOTE) Pre diabetes:          5.7%-6.4%  Diabetes:              >6.4%  Glycemic control for   <7.0% adults with diabetes     CBG: Recent Labs  Lab 04/20/20 1549 04/20/20 2000 04/20/20 2348 04/21/20 0724 04/21/20 1232  GLUCAP 239* 183* 92 106* 244*    Review of Systems:   Unable to review given encephalopathy   Past Medical History:  She,  has a past medical history of Asthma and Hypertension.   Surgical History:  Unknown    Social History:   unknown   Family History:  Her family history is not on file.   Allergies No Known Allergies   Home Medications  Prior to Admission medications   Not  on File     Critical care time:      CRITICAL CARE Performed by: Karren Burly   Total critical care time: 36 minutes  Critical care time was exclusive of separately billable procedures and treating other patients.  Critical care was necessary to treat or prevent imminent or life-threatening deterioration.  Critical care was time spent personally by me on the following activities: development of treatment plan with  patient and/or surrogate as well as nursing, discussions with consultants, evaluation of patient's response to treatment, examination of patient, obtaining history from patient or surrogate, ordering and performing treatments and interventions, ordering and review of laboratory studies, ordering and review of radiographic studies, pulse oximetry and re-evaluation of patient's condition.

## 2020-04-22 DIAGNOSIS — J9621 Acute and chronic respiratory failure with hypoxia: Secondary | ICD-10-CM

## 2020-04-22 DIAGNOSIS — J9622 Acute and chronic respiratory failure with hypercapnia: Secondary | ICD-10-CM

## 2020-04-22 LAB — BASIC METABOLIC PANEL
Anion gap: 8 (ref 5–15)
BUN: 36 mg/dL — ABNORMAL HIGH (ref 8–23)
CO2: 32 mmol/L (ref 22–32)
Calcium: 8.7 mg/dL — ABNORMAL LOW (ref 8.9–10.3)
Chloride: 100 mmol/L (ref 98–111)
Creatinine, Ser: 1.36 mg/dL — ABNORMAL HIGH (ref 0.44–1.00)
GFR, Estimated: 42 mL/min — ABNORMAL LOW (ref >60)
Glucose, Bld: 211 mg/dL — ABNORMAL HIGH (ref 70–99)
Potassium: 4.3 mmol/L (ref 3.5–5.1)
Sodium: 140 mmol/L (ref 135–145)

## 2020-04-22 LAB — CBC
HCT: 43.4 % (ref 36.0–46.0)
Hemoglobin: 12.5 g/dL (ref 12.0–15.0)
MCH: 22.4 pg — ABNORMAL LOW (ref 26.0–34.0)
MCHC: 28.8 g/dL — ABNORMAL LOW (ref 30.0–36.0)
MCV: 77.8 fL — ABNORMAL LOW (ref 80.0–100.0)
Platelets: 221 10*3/uL (ref 150–400)
RBC: 5.58 MIL/uL — ABNORMAL HIGH (ref 3.87–5.11)
RDW: 15.8 % — ABNORMAL HIGH (ref 11.5–15.5)
WBC: 5.6 10*3/uL (ref 4.0–10.5)
nRBC: 0.4 % — ABNORMAL HIGH (ref 0.0–0.2)

## 2020-04-22 LAB — GLUCOSE, CAPILLARY
Glucose-Capillary: 174 mg/dL — ABNORMAL HIGH (ref 70–99)
Glucose-Capillary: 271 mg/dL — ABNORMAL HIGH (ref 70–99)
Glucose-Capillary: 210 mg/dL — ABNORMAL HIGH (ref 70–99)
Glucose-Capillary: 228 mg/dL — ABNORMAL HIGH (ref 70–99)

## 2020-04-22 MED ORDER — INSULIN DETEMIR 100 UNIT/ML ~~LOC~~ SOLN
10.0000 [IU] | Freq: Every day | SUBCUTANEOUS | Status: DC
  Administered 2020-04-22 – 2020-04-25 (×4): 10 [IU] via SUBCUTANEOUS
  Filled 2020-04-22 (×5): qty 0.1

## 2020-04-22 MED ORDER — FUROSEMIDE 10 MG/ML IJ SOLN
40.0000 mg | Freq: Once | INTRAMUSCULAR | Status: AC
  Administered 2020-04-22: 40 mg via INTRAVENOUS
  Filled 2020-04-22: qty 4

## 2020-04-22 MED ORDER — FUROSEMIDE 10 MG/ML IJ SOLN: 80.0000 mg | Freq: Once | INTRAMUSCULAR | Status: DC

## 2020-04-22 NOTE — Progress Notes (Signed)
NAMEBlannie Norton, MRN:  [id number], DOB:  Aug 21, 1950, LOS: 3 ADMISSION DATE:  04/18/2020, CONSULTATION DATE:  04/19/2020 REFERRING MD:  Dr. Clayborne Dana, CHIEF COMPLAINT:  Respiratory Failure    History of Present Illness:  70 year old obese female presents to ED on 1/19 with progressive shortness of breath and hypoxia. On arrival to ED patient is alert and oriented. Reports feeling unwell for last week with fever, cough, and weakness. Reports fully vaccinated with last vaccine 10/2019. Oxygen Saturation 67% on room air, placed on NRB with improvement. While in ED patient with noted to be obtunded with tachypnea. Placed on BiPAP. ABG 7.085/>120/126. CXR with Cardiomegaly with bilateral infiltrates   Past Medical History:  Asthma, HLD   Significant Hospital Events:  1/20 > Presents to ED, start steroids, hypercarbia improved with biPAP 1/21 stable, heated HFNC, altered off BiPAP, improved on BiPAP, d1 baricitinib  1/22 weaning O2, OOB to chair 1/23 Transfer to progressive  Consults:  PCCM  Procedures:    Significant Diagnostic Tests:  CXR 1/19 > Cardiomegaly with moderate to marked severity bilateral Infiltrates CT Head 1/20 >> Nl  Micro Data:  Blood 1/20 >> U/A 1/20 >>  Antimicrobials:     Interim History / Subjective:  BiPAP at night tolerated well . FiO2 weaning on HFNC. Feeling better.   Objective   Blood pressure 114/72, pulse 81, temperature (!) 97.5 F (36.4 C), temperature source Axillary, resp. rate 19, height 5\' 4"  (1.626 m), weight 116.1 kg, SpO2 93 %.    Vent Mode: BIPAP FiO2 (%):  [60 %-80 %] 60 % Set Rate:  [12 bmp] 12 bmp PEEP:  [5 cmH20] 5 cmH20   Intake/Output Summary (Last 24 hours) at 04/22/2020 0841 Last data filed at 04/21/2020 2000 Gross per 24 hour  Intake 1060.06 ml  Output --  Net 1060.06 ml   Filed Weights   04/20/20 0408 04/21/20 0500 04/22/20 0349  Weight: 115 kg 115.5 kg 116.1 kg    Examination: General: Obese adult female, lying  in bed  HENT: HFNC in place Lungs: Diminished, no crackles, mild tachypnea  Cardiovascular: RRR, no murumr Abdomen: obese, active bowel sounds  Extremities: +1 BLE edema  Neuro: alert, oriented, conversant GU: intact   Resolved Hospital Problem list     Assessment & Plan:   Encephalopathy secondary to hypercarbic state: Resolved. 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 Mild volume overload contributing. Improving. -Continue BiPAP at night, HFNC during day -Titrate Supplemental Oxygen for Saturation Goal >90 -Continue Decadron, baricitinib  LLE DVT: on PVL 1/20. Provoked in setting of COVID. --Apixaban (load x 7 days then at least 3 months)  Volume overload due to acute diastolic heart failure -Lasix 40 mg 1/20 with good UOP, mild Cr bump 1/21 with poor PO intake, Cr trended down, Cl rising - redose lasix 40 mg IV x 1 1/23 -TTE G2 ddfxn, mildly dilated LA, mildly dilated RA, trivial MVR, mild TR, RA pressure 8  Acute Renal Injury: unsure baseline kidney function, if acute secondary to ATN due to hypoperfusion/hypoxic. Stable. -Trend BMP -Replace electrolytes as indicated   Demand ischemia: mild troponin elevation that has trended down.  Diabetes: Hgb A1c ~10 --SSI, meal time insulin, basal 10 u daily   Best practice (evaluated daily)  Diet: regular DVT prophylaxis: apixaben (LLE DVT)  GI prophylaxis: PPI Glucose control: Trend Glucose, SSI,  Mobility: bedrest  Disposition:ICU  Goals of Care:  Last date of multidisciplinary  goals of care discussion: n/a Family and staff present:  Summary of discussion:  Follow up goals of care discussion due: 1/26 Code Status: Full Code.   Labs   CBC: Recent Labs  Lab 04/18/20 2316 04/19/20 0741 04/20/20 0319 04/21/20 1708 04/22/20 0217  WBC 8.1  --  8.6 5.2 5.6  NEUTROABS 5.3  --   --   --   --   HGB 14.2 15.6* 13.2 12.6 12.5  HCT  48.0* 46.0 44.8 45.2 43.4  MCV 77.9*  --  78.6* 78.1* 77.8*  PLT 238  --  206 224 221    Basic Metabolic Panel: Recent Labs  Lab 04/18/20 2316 04/19/20 0741 04/20/20 0319 04/21/20 1708 04/22/20 0217  NA 142 141 143 137 140  K 3.7 4.5 3.7 4.4 4.3  CL 99  --  100 98 100  CO2 31  --  31 30 32  GLUCOSE 265*  --  144* 347* 211*  BUN 15  --  32* 35* 36*  CREATININE 1.29*  --  1.54* 1.26* 1.36*  CALCIUM 9.2  --  8.8* 8.5* 8.7*  MG  --   --  2.0  --   --   PHOS  --   --  5.1*  --   --    GFR: Estimated Creatinine Clearance: 48.9 mL/min (A) (by C-G formula based on SCr of 1.36 mg/dL (H)). Recent Labs  Lab 04/18/20 2316 04/19/20 0225 04/19/20 0235 04/20/20 0319 04/21/20 1708 04/22/20 0217  PROCALCITON  --   --  <0.10  --   --   --   WBC 8.1  --   --  8.6 5.2 5.6  LATICACIDVEN 1.1 0.9  --   --   --   --     Liver Function Tests: Recent Labs  Lab 04/18/20 2316  AST 46*  ALT 41  ALKPHOS 74  BILITOT 0.7  PROT 7.5  ALBUMIN 3.2*   No results for input(s): LIPASE, AMYLASE in the last 168 hours. No results for input(s): AMMONIA in the last 168 hours.  ABG    Component Value Date/Time   PHART 7.276 (L) 04/19/2020 0741   PCO2ART 81.3 (HH) 04/19/2020 0741   PO2ART 103 04/19/2020 0741   HCO3 37.9 (H) 04/19/2020 0741   TCO2 40 (H) 04/19/2020 0741   O2SAT 97.0 04/19/2020 0741     Coagulation Profile: Recent Labs  Lab 04/18/20 2316  INR 1.1    Cardiac Enzymes: No results for input(s): CKTOTAL, CKMB, CKMBINDEX, TROPONINI in the last 168 hours.  HbA1C: Hgb A1c MFr Bld  Date/Time Value Ref Range Status  04/20/2020 03:19 AM 10.1 (H) 4.8 - 5.6 % Final    Comment:    (NOTE) Pre diabetes:          5.7%-6.4%  Diabetes:              >6.4%  Glycemic control for   <7.0% adults with diabetes     CBG: Recent Labs  Lab 04/21/20 0724 04/21/20 1232 04/21/20 1543 04/21/20 2150 04/22/20 0720  GLUCAP 106* 244* 328* 294* 174*    Review of Systems:   Unable to  review given encephalopathy   Past Medical History:  She,  has a past medical history of Asthma and Hypertension.   Surgical History:  Unknown    Social History:   unknown   Family History:  Her family history is not on file.   Allergies No Known Allergies   Home Medications  Prior  to Admission medications   Not on File     Critical care time:      CRITICAL CARE n/a

## 2020-04-22 NOTE — Progress Notes (Signed)
Pt transitioned from [facility] to 6L [place]. Pt is tolerating well at this time. RN aware.

## 2020-04-22 NOTE — Progress Notes (Signed)
Removed pt from BIPAP and placed on HHFNC 30L/60%. Pt is tolerating well at this time.

## 2020-04-23 DIAGNOSIS — R0902 Hypoxemia: Secondary | ICD-10-CM

## 2020-04-23 LAB — CBC
HCT: 40.8 % (ref 36.0–46.0)
Hemoglobin: 12.3 g/dL (ref 12.0–15.0)
MCH: 22.9 pg — ABNORMAL LOW (ref 26.0–34.0)
MCHC: 30.1 g/dL (ref 30.0–36.0)
MCV: 76 fL — ABNORMAL LOW (ref 80.0–100.0)
Platelets: 230 10*3/uL (ref 150–400)
RBC: 5.37 MIL/uL — ABNORMAL HIGH (ref 3.87–5.11)
RDW: 15.6 % — ABNORMAL HIGH (ref 11.5–15.5)
WBC: 5.7 10*3/uL (ref 4.0–10.5)
nRBC: 0 % (ref 0.0–0.2)

## 2020-04-23 LAB — BASIC METABOLIC PANEL
Anion gap: 9 (ref 5–15)
BUN: 32 mg/dL — ABNORMAL HIGH (ref 8–23)
CO2: 32 mmol/L (ref 22–32)
Calcium: 8.7 mg/dL — ABNORMAL LOW (ref 8.9–10.3)
Chloride: 98 mmol/L (ref 98–111)
Creatinine, Ser: 1.2 mg/dL — ABNORMAL HIGH (ref 0.44–1.00)
GFR, Estimated: 49 mL/min — ABNORMAL LOW (ref >60)
Glucose, Bld: 108 mg/dL — ABNORMAL HIGH (ref 70–99)
Potassium: 3.5 mmol/L (ref 3.5–5.1)
Sodium: 139 mmol/L (ref 135–145)

## 2020-04-23 LAB — GLUCOSE, CAPILLARY
Glucose-Capillary: 94 mg/dL (ref 70–99)
Glucose-Capillary: 241 mg/dL — ABNORMAL HIGH (ref 70–99)
Glucose-Capillary: 254 mg/dL — ABNORMAL HIGH (ref 70–99)
Glucose-Capillary: 150 mg/dL — ABNORMAL HIGH (ref 70–99)

## 2020-04-23 MED ORDER — ATORVASTATIN CALCIUM 40 MG PO TABS
40.0000 mg | ORAL_TABLET | Freq: Every day | ORAL | Status: DC
  Administered 2020-04-23 – 2020-04-25 (×3): 40 mg via ORAL
  Filled 2020-04-23 (×3): qty 1

## 2020-04-23 MED ORDER — CARVEDILOL 3.125 MG PO TABS
3.1250 mg | ORAL_TABLET | Freq: Two times a day (BID) | ORAL | Status: DC
  Administered 2020-04-23 – 2020-04-25 (×4): 3.125 mg via ORAL
  Filled 2020-04-23 (×3): qty 1

## 2020-04-23 NOTE — Progress Notes (Signed)
PROGRESS NOTE                                                                                                                                                                                                             Patient Demographics:    Chelsea Norton, is a 70 y.o. female, DOB - 10/27/1950, ZOX:[id number]  Outpatient Primary MD for the patient is Patient, No Pcp Per    LOS - 4  Admit date - 04/18/2020    Chief Complaint  Patient presents with  . Shortness of Breath       Brief Narrative (HPI from H&P)  70 year old obese female presents to ED on 1/19 with progressive shortness of breath and hypoxia.  She was diagnosed with acute hypoxic respiratory failure due to COVID-19 pneumonia along with underlying OHS and OSA.  Required heated high flow and BiPAP, was treated with IV steroids remdesivir and Baricitinib by pulmonary critical care.  Stabilized and transferred to hospitalist service on 04/23/2020 on day 4 of her hospital stay.   Subjective:    Chelsea Norton today has, No headache, No chest pain, No abdominal pain - No Nausea, No new weakness tingling or numbness, no SOB   Assessment  & Plan :     1. Acute Hypoxic Resp. Failure due to Acute Covid 19 Viral Pneumonitis during the ongoing 2020 Covid 19 Pandemic - she has had 2 mRNA shots, there was also element of underlying OHS and OSA to her respiratory failure.  She was initially treated with heated high flow and BiPAP, now stable on nasal cannula.  She has received IV steroids, Remdesivir and Baricitinib.  Encouraged the patient to sit up in chair in the daytime use I-S and flutter valve for pulmonary toiletry and then prone in bed when at night.  Will advance activity and titrate down oxygen as possible.  SpO2: 93 % O2 Flow Rate (L/min): 2 L/min FiO2 (%): (S) 40 %  Recent Labs  Lab 04/18/20 2313 04/18/20 2316 04/18/20 2316 04/19/20 0225 04/19/20 0235  04/19/20 0741 04/19/20 1102 04/20/20 0319 04/21/20 1708 04/22/20 0217 04/23/20 0418  WBC  --  8.1  --   --   --   --   --  8.6 5.2 5.6 5.7  HGB  --  14.2   < >  --   --  15.6*  --  13.2 12.6 12.5 12.3  HCT  --  48.0*   < >  --   --  46.0  --  44.8 45.2 43.4 40.8  PLT  --  238  --   --   --   --   --  206 224 221 230  CRP  --   --   --   --  6.2*  --   --   --   --   --   --   BNP  --   --   --   --   --   --  306.9*  --   --   --   --   DDIMER  --   --   --   --  3.72*  --   --   --   --   --   --   PROCALCITON  --   --   --   --  <0.10  --   --   --   --   --   --   AST  --  46*  --   --   --   --   --   --   --   --   --   ALT  --  41  --   --   --   --   --   --   --   --   --   ALKPHOS  --  74  --   --   --   --   --   --   --   --   --   BILITOT  --  0.7  --   --   --   --   --   --   --   --   --   ALBUMIN  --  3.2*  --   --   --   --   --   --   --   --   --   INR  --  1.1  --   --   --   --   --   --   --   --   --   LATICACIDVEN  --  1.1  --  0.9  --   --   --   --   --   --   --   SARSCOV2NAA POSITIVE*  --   --   --   --   --   --   --   --   --   --    < > = values in this interval not displayed.    Encephalopathy secondary to hypercarbic state: Resolved. CT head clear  Acute LLE DVT: on Eliquis.  Minimum 3 months.  Volume overload due to acute diastolic heart failure - echo noted with preserved EF of 55%, has been adequately diuresed will monitor use as needed Lasix as needed   Acute Renal Injury:  Due to ATN.  Stable.  Demand ischemia:  Due to demand mismatch from hypoxia, echo shows preserved EF with no wall motion abnormality.  Blood pressure is stable will add low-dose beta-blocker and Lipitor, already on anticoagulation for blood clot.  Diabetes:  Poor outpatient control due to hyperglycemia A1c of 10, on long-acting insulin and sliding scale, DM and insulin education. Lab Results  Component Value Date   HGBA1C 10.1 (H) 04/20/2020   CBG (last 3)  Recent  Labs    04/22/20 1652 04/22/20 2103 04/23/20 0710  GLUCAP 210* 228* 94          Condition - Extremely Guarded  Family Communication  : daughter Modesto CharonMiss Arnoldo [phone] on 04/23/2020  Code Status :  Full  Consults  :  PCCM  Procedures  :    TTE - 1. Left ventricular ejection fraction, by estimation, is 55 to 60%. The left ventricle has normal function. The left ventricle has no regional wall motion abnormalities. There is moderate concentric left ventricular hypertrophy. Left ventricular diastolic parameters are consistent with Grade II diastolic dysfunction (pseudonormalization). Elevated left ventricular end-diastolic pressure.  2. Right ventricular systolic function is normal. The right ventricular size is normal. There is mildly elevated pulmonary artery systolic pressure.  3. Left atrial size was mildly dilated.  4. Right atrial size was mildly dilated.  5. The mitral valve is normal in structure. Trivial mitral valve regurgitation. No evidence of mitral stenosis.  6. The aortic valve is tricuspid. There is mild calcification of the aortic valve. Aortic valve regurgitation is not visualized. Mild aortic valve sclerosis is present, with no evidence of aortic valve stenosis.  7. Aortic dilatation noted. There is mild to moderate dilatation of the ascending aorta, measuring 42 mm.  8. The inferior vena cava is dilated in size with >50% respiratory variability, suggesting right atrial pressure of 8 mmHg.   Leg US - Acute L.Leg DVT  CT head.  Nonacute..  PUD Prophylaxis : none  Disposition Plan  :    Status is: Inpatient  Remains inpatient appropriate because:IV treatments appropriate due to intensity of illness or inability to take PO   Dispo: The patient is from: Home              Anticipated d/c is to: Home              Anticipated d/c date is: 3 days              Patient currently is not medically stable to d/c.   Difficult to place patient No   DVT Prophylaxis  :   Eliquis  Lab Results  Component Value Date   PLT 230 04/23/2020    Diet :  Diet Order            Diet Carb Modified Fluid consistency: Thin; Room service appropriate? Yes  Diet effective now                  Inpatient Medications  Scheduled Meds: . apixaban  10 mg Oral BID   Followed by  . [START ON 04/26/2020] apixaban  5 mg Oral BID  . baricitinib  2 mg Oral Daily  . chlorhexidine  15 mL Mouth Rinse BID  . Chlorhexidine Gluconate Cloth  6 each Topical Daily  . dexamethasone (DECADRON) injection  6 mg Intravenous Q24H  . insulin aspart  0-15 Units Subcutaneous TID WC  . insulin aspart  0-5 Units Subcutaneous QHS  . insulin aspart  5 Units Subcutaneous TID WC  . insulin detemir  10 Units Subcutaneous Daily  . mouth rinse  15 mL Mouth Rinse q12n4p   Continuous Infusions: PRN Meds:.polyethylene glycol  Antibiotics  :    Anti-infectives (From admission, onward)   Start     Dose/Rate Route Frequency Ordered Stop   04/20/20 1000  remdesivir 100 mg in sodium chloride 0.9 % 100 mL IVPB       "Followed by" Linked Group Details  100 mg 200 mL/hr over 30 Minutes Intravenous Daily 04/19/20 0055 04/23/20 0958   04/19/20 0200  remdesivir 200 mg in sodium chloride 0.9% 250 mL IVPB       "Followed by" Linked Group Details   200 mg 580 mL/hr over 30 Minutes Intravenous Once 04/19/20 0055 04/19/20 0343   04/18/20 2345  azithromycin (ZITHROMAX) 500 mg in sodium chloride 0.9 % 250 mL IVPB  Status:  Discontinued        500 mg 250 mL/hr over 60 Minutes Intravenous  Once 04/18/20 2343 04/19/20 0049       Time Spent in minutes  30   Susa RaringPrashant Singh M.D on 04/23/2020 at 10:55 AM  To page go to [email or web address]   [facility] -  Office  [phone]   See all Orders from today for further details    Objective:   Vitals:   04/23/20 0353 04/23/20 0400 04/23/20 0500 04/23/20 0600  BP:  133/82  127/74  Pulse:  84 78 80  Resp:  19 (!) 23 (!) 28  Temp:      TempSrc:       SpO2:  98% 95% 93%  Weight: 116.7 kg     Height:        Wt Readings from Last 3 Encounters:  04/23/20 116.7 kg     Intake/Output Summary (Last 24 hours) at 04/23/2020 1055 Last data filed at 04/23/2020 0000 Gross per 24 hour  Intake 819.87 ml  Output -  Net 819.87 ml     Physical Exam  Awake Alert, No new F.N deficits, Normal affect [place].AT,PERRAL Supple Neck,No JVD, No cervical lymphadenopathy appriciated.  Symmetrical Chest wall movement, Good air movement bilaterally, CTAB RRR,No Gallops,Rubs or new Murmurs, No Parasternal Heave +ve B.Sounds, Abd Soft, No tenderness, No organomegaly appriciated, No rebound - guarding or rigidity. No Cyanosis, Clubbing or edema, No new Rash or bruise       Data Review:    CBC Recent Labs  Lab 04/18/20 2316 04/19/20 0741 04/20/20 0319 04/21/20 1708 04/22/20 0217 04/23/20 0418  WBC 8.1  --  8.6 5.2 5.6 5.7  HGB 14.2 15.6* 13.2 12.6 12.5 12.3  HCT 48.0* 46.0 44.8 45.2 43.4 40.8  PLT 238  --  206 224 221 230  MCV 77.9*  --  78.6* 78.1* 77.8* 76.0*  MCH 23.1*  --  23.2* 21.8* 22.4* 22.9*  MCHC 29.6*  --  29.5* 27.9* 28.8* 30.1  RDW 17.2*  --  16.3* 15.9* 15.8* 15.6*  LYMPHSABS 2.0  --   --   --   --   --   MONOABS 0.6  --   --   --   --   --   EOSABS 0.0  --   --   --   --   --   BASOSABS 0.0  --   --   --   --   --     Recent Labs  Lab 04/18/20 2316 04/19/20 0225 04/19/20 0235 04/19/20 0741 04/19/20 1102 04/20/20 0319 04/21/20 1708 04/22/20 0217 04/23/20 0418  NA 142  --   --  141  --  143 137 140 139  K 3.7  --   --  4.5  --  3.7 4.4 4.3 3.5  CL 99  --   --   --   --  100 98 100 98  CO2 31  --   --   --   --  31 30 32 32  GLUCOSE 265*  --   --   --   --  144* 347* 211* 108*  BUN 15  --   --   --   --  32* 35* 36* 32*  CREATININE 1.29*  --   --   --   --  1.54* 1.26* 1.36* 1.20*  CALCIUM 9.2  --   --   --   --  8.8* 8.5* 8.7* 8.7*  AST 46*  --   --   --   --   --   --   --   --   ALT 41  --   --   --   --   --    --   --   --   ALKPHOS 74  --   --   --   --   --   --   --   --   BILITOT 0.7  --   --   --   --   --   --   --   --   ALBUMIN 3.2*  --   --   --   --   --   --   --   --   MG  --   --   --   --   --  2.0  --   --   --   CRP  --   --  6.2*  --   --   --   --   --   --   DDIMER  --   --  3.72*  --   --   --   --   --   --   PROCALCITON  --   --  <0.10  --   --   --   --   --   --   LATICACIDVEN 1.1 0.9  --   --   --   --   --   --   --   INR 1.1  --   --   --   --   --   --   --   --   HGBA1C  --   --   --   --   --  10.1*  --   --   --   BNP  --   --   --   --  306.9*  --   --   --   --     ------------------------------------------------------------------------------------------------------------------ No results for input(s): CHOL, HDL, LDLCALC, TRIG, CHOLHDL, LDLDIRECT in the last 72 hours.  Lab Results  Component Value Date   HGBA1C 10.1 (H) 04/20/2020   ------------------------------------------------------------------------------------------------------------------ No results for input(s): TSH, T4TOTAL, T3FREE, THYROIDAB in the last 72 hours.  Invalid input(s): FREET3  Cardiac Enzymes No results for input(s): CKMB, TROPONINI, MYOGLOBIN in the last 168 hours.  Invalid input(s): CK ------------------------------------------------------------------------------------------------------------------    Component Value Date/Time   BNP 306.9 (H) 04/19/2020 1102    Micro Results Recent Results (from the past 240 hour(s))  Culture, blood (Routine x 2)     Status: None (Preliminary result)   Collection Time: 04/18/20 10:15 PM   Specimen: BLOOD LEFT HAND  Result Value Ref Range Status   Specimen Description BLOOD LEFT HAND  Final   Special Requests   Final    BOTTLES DRAWN AEROBIC AND ANAEROBIC Blood Culture adequate volume   Culture   Final    NO GROWTH 3 DAYS Performed at [facility], [address] [place], [place] [address]    Report Status  PENDING  Incomplete   Resp Panel by RT-PCR (Flu A&B, Covid) Nasopharyngeal Swab     Status: Abnormal   Collection Time: 04/18/20 11:13 PM   Specimen: Nasopharyngeal Swab; Nasopharyngeal(NP) swabs in vial transport medium  Result Value Ref Range Status   SARS Coronavirus 2 by RT PCR POSITIVE (A) NEGATIVE Final    Comment: RESULT CALLED TO, READ BACK BY AND VERIFIED WITH: E SULLIVAN RN 04/19/20 0046 JDW (NOTE) SARS-CoV-2 target nucleic acids are DETECTED.  The SARS-CoV-2 RNA is generally detectable in upper respiratory specimens during the acute phase of infection. Positive results are indicative of the presence of the identified virus, but do not rule out bacterial infection or co-infection with other pathogens not detected by the test. Clinical correlation with patient history and other diagnostic information is necessary to determine patient infection status. The expected result is Negative.  Fact Sheet for Patients: BloggerCourse.com  Fact Sheet for Healthcare Providers: SeriousBroker.it  This test is not yet approved or cleared by the [place] FDA and  has been authorized for detection and/or diagnosis of SARS-CoV-2 by FDA under an Emergency Use Authorization (EUA).  This EUA will remain in effect (meaning this test can be [name] ed) for the duration of  the COVID-19 declaration under Section 564(b)(1) of the Act, 21 U.S.C. section 360bbb-3(b)(1), unless the authorization is terminated or revoked sooner.     Influenza A by PCR NEGATIVE NEGATIVE Final   Influenza B by PCR NEGATIVE NEGATIVE Final    Comment: (NOTE) The Xpert Xpress SARS-CoV-2/FLU/RSV plus assay is intended as an aid in the diagnosis of influenza from Nasopharyngeal swab specimens and should not be used as a sole basis for treatment. Nasal washings and aspirates are unacceptable for Xpert Xpress SARS-CoV-2/FLU/RSV testing.  Fact Sheet for  Patients: BloggerCourse.com  Fact Sheet for Healthcare Providers: SeriousBroker.it  This test is not yet approved or cleared by the [place] FDA and has been authorized for detection and/or diagnosis of SARS-CoV-2 by FDA under an Emergency Use Authorization (EUA). This EUA will remain in effect (meaning this test can be used) for the duration of the COVID-19 declaration under Section 564(b)(1) of the Act, 21 U.S.C. section 360bbb-3(b)(1), unless the authorization is terminated or revoked.  Performed at [facility], [address] [place], [place] [address]   Culture, blood (Routine x 2)     Status: None (Preliminary result)   Collection Time: 04/19/20  2:35 AM   Specimen: BLOOD LEFT HAND  Result Value Ref Range Status   Specimen Description BLOOD LEFT HAND  Final   Special Requests   Final    BOTTLES DRAWN AEROBIC AND ANAEROBIC Blood Culture adequate volume   Culture   Final    NO GROWTH 3 DAYS Performed at [facility], [address], [place], [place] [address]    Report Status PENDING  Incomplete  MRSA PCR Screening     Status: None   Collection Time: 04/19/20  8:25 AM   Specimen: Nasal Mucosa; Nasopharyngeal  Result Value Ref Range Status   MRSA by PCR NEGATIVE NEGATIVE Final    Comment:        The GeneXpert MRSA Assay (FDA approved for NASAL specimens only), is one component of a comprehensive MRSA colonization surveillance program. It is not intended to diagnose MRSA infection nor to guide or monitor treatment for MRSA infections. Performed at [facility], [address] [place], [place] [address]     Radiology Reports DG  Chest 2 View  Result Date: 04/18/2020 CLINICAL DATA:  Shortness of breath. EXAM: CHEST - 2 VIEW COMPARISON:  None. FINDINGS: Moderate to marked severity patchy infiltrates are seen within the bilateral lung bases and mid lung fields, bilaterally. There is no evidence  of a pleural effusion or pneumothorax. The cardiac silhouette is markedly enlarged. Widening of the mediastinum is also seen which may be positional in nature. Degenerative changes are seen throughout the thoracic spine. IMPRESSION: 1. Cardiomegaly with moderate to marked severity bilateral infiltrates. Electronically Signed   By: Aram Candela M.D.   On: 04/18/2020 23:32   CT HEAD WO CONTRAST  Result Date: 04/19/2020 CLINICAL DATA:  Mental status change, persistent or worsening. Shortness of breath on BiPAP EXAM: CT HEAD WITHOUT CONTRAST TECHNIQUE: Contiguous axial images were obtained from the base of the skull through the vertex without intravenous contrast. COMPARISON:  None. FINDINGS: Brain: No evidence of acute infarction, hemorrhage, hydrocephalus, extra-axial collection or mass lesion/mass effect. Patchy low-density areas in the bilateral cerebral white matter the out detected swelling, likely chronic small vessel disease related to patient's history of hypertension. Some of these areas are subcortical and very low-density, likely dilated perivascular spaces. Vascular: No hyperdense vessel or unexpected calcification. Skull: Normal. Negative for fracture or focal lesion. Sinuses/Orbits: No acute finding. IMPRESSION: 1. No acute finding. 2. Chronic white matter disease. Electronically Signed   By: Marnee Spring M.D.   On: 04/19/2020 07:11   ECHOCARDIOGRAM COMPLETE  Result Date: 04/19/2020    ECHOCARDIOGRAM REPORT   Patient Name:   CHANIECE BARBATO Date of Exam: 04/19/2020 Medical Rec #:  [id number]        Height:       64.0 in Accession #:    [id number]       Weight:       247.6 lb Date of Birth:  [birth year]       BSA:          2.142 m Patient Age:    69 years         BP:           120/71 mmHg Patient Gender: F                HR:           92 bpm. Exam Location:  Inpatient Procedure: 2D Echo, Cardiac Doppler and Color Doppler Indications:    I50.23 Acute on chronic systolic (congestive) heart  failure  History:        Patient has no prior history of Echocardiogram examinations.                 Risk Factors:Hypertension. COVID-19 Positive.  Sonographer:    Tiffany Dance Referring Phys: [id number] Tobey Grim IMPRESSIONS  1. Left ventricular ejection fraction, by estimation, is 55 to 60%. The left ventricle has normal function. The left ventricle has no regional wall motion abnormalities. There is moderate concentric left ventricular hypertrophy. Left ventricular diastolic parameters are consistent with Grade II diastolic dysfunction (pseudonormalization). Elevated left ventricular end-diastolic pressure.  2. Right ventricular systolic function is normal. The right ventricular size is normal. There is mildly elevated pulmonary artery systolic pressure.  3. Left atrial size was mildly dilated.  4. Right atrial size was mildly dilated.  5. The mitral valve is normal in structure. Trivial mitral valve regurgitation. No evidence of mitral stenosis.  6. The aortic valve is tricuspid. There is mild calcification of the aortic valve. Aortic valve regurgitation is not visualized.  Mild aortic valve sclerosis is present, with no evidence of aortic valve stenosis.  7. Aortic dilatation noted. There is mild to moderate dilatation of the ascending aorta, measuring 42 mm.  8. The inferior vena cava is dilated in size with >50% respiratory variability, suggesting right atrial pressure of 8 mmHg. Comparison(s): No prior Echocardiogram. FINDINGS  Left Ventricle: Left ventricular ejection fraction, by estimation, is 55 to 60%. The left ventricle has normal function. The left ventricle has no regional wall motion abnormalities. The left ventricular internal cavity size was normal in size. There is  moderate concentric left ventricular hypertrophy. Left ventricular diastolic parameters are consistent with Grade II diastolic dysfunction (pseudonormalization). Elevated left ventricular end-diastolic pressure. Right Ventricle:  The right ventricular size is normal. Right vetricular wall thickness was not well visualized. Right ventricular systolic function is normal. There is mildly elevated pulmonary artery systolic pressure. The tricuspid regurgitant velocity  is 2.73 m/s, and with an assumed right atrial pressure of 8 mmHg, the estimated right ventricular systolic pressure is 37.8 mmHg. Left Atrium: Left atrial size was mildly dilated. Right Atrium: Right atrial size was mildly dilated. Pericardium: Trivial pericardial effusion is present. Mitral Valve: The mitral valve is normal in structure. Mild mitral annular calcification. Trivial mitral valve regurgitation. No evidence of mitral valve stenosis. Tricuspid Valve: The tricuspid valve is normal in structure. Tricuspid valve regurgitation is mild . No evidence of tricuspid stenosis. Aortic Valve: The aortic valve is tricuspid. There is mild calcification of the aortic valve. Aortic valve regurgitation is not visualized. Mild aortic valve sclerosis is present, with no evidence of aortic valve stenosis. Pulmonic Valve: The pulmonic valve was not well visualized. Pulmonic valve regurgitation is mild. No evidence of pulmonic stenosis. Aorta: Aortic dilatation noted. There is mild to moderate dilatation of the ascending aorta, measuring 42 mm. Venous: The inferior vena cava is dilated in size with greater than 50% respiratory variability, suggesting right atrial pressure of 8 mmHg. IAS/Shunts: The atrial septum is grossly normal.  LEFT VENTRICLE PLAX 2D LVIDd:         3.50 cm  Diastology LVIDs:         2.40 cm  LV e' medial:    4.35 cm/s LV PW:         1.50 cm  LV E/e' medial:  16.6 LV IVS:        1.50 cm  LV e' lateral:   4.68 cm/s LVOT diam:     2.00 cm  LV E/e' lateral: 15.5 LV SV:         68 LV SV Index:   32 LVOT Area:     3.14 cm  RIGHT VENTRICLE             IVC RV Basal diam:  2.70 cm     IVC diam: 2.90 cm RV S prime:     11.40 cm/s TAPSE (M-mode): 2.0 cm LEFT ATRIUM              Index       RIGHT ATRIUM           Index LA diam:        3.60 cm 1.68 cm/m  RA Area:     20.40 cm LA Vol (A2C):   49.2 ml 22.97 ml/m RA Volume:   66.40 ml  31.00 ml/m LA Vol (A4C):   67.3 ml 31.42 ml/m LA Biplane Vol: 57.8 ml 26.98 ml/m  AORTIC VALVE LVOT Vmax:   117.00 cm/s LVOT Vmean:  74.100 cm/s LVOT VTI:    0.218 m  AORTA Ao Root diam: 3.50 cm Ao Asc diam:  4.20 cm MITRAL VALVE               TRICUSPID VALVE MV Area (PHT): 3.03 cm    TR Peak grad:   29.8 mmHg MV Decel Time: 250 msec    TR Vmax:        273.00 cm/s MV E velocity: 72.40 cm/s MV A velocity: 67.70 cm/s  SHUNTS MV E/A ratio:  1.07        Systemic VTI:  0.22 m                            Systemic Diam: 2.00 cm Jodelle Red MD Electronically signed by Jodelle Red MD Signature Date/Time: 04/19/2020/2:56:42 PM    Final    VAS [name] LOWER EXTREMITY VENOUS (DVT)  Result Date: 04/19/2020  Lower Venous DVT Study Other Indications: Covid, D-dimer. Risk Factors: None identified. Comparison Study: No previous exams Performing Technologist: Clint Guy RVT  Examination Guidelines: A complete evaluation includes B-mode imaging, spectral Doppler, color Doppler, and power Doppler as needed of all accessible portions of each vessel. Bilateral testing is considered an integral part of a complete examination. Limited examinations for reoccurring indications may be performed as noted. The reflux portion of the exam is performed with the patient in reverse Trendelenburg.  +---------+---------------+---------+-----------+----------+--------------+ RIGHT    CompressibilityPhasicitySpontaneityPropertiesThrombus Aging +---------+---------------+---------+-----------+----------+--------------+ CFV      Full           Yes      Yes                                 +---------+---------------+---------+-----------+----------+--------------+ SFJ      Full                                                         +---------+---------------+---------+-----------+----------+--------------+ FV Prox  Full                                                        +---------+---------------+---------+-----------+----------+--------------+ FV Mid   Full                                                        +---------+---------------+---------+-----------+----------+--------------+ FV DistalFull                                                        +---------+---------------+---------+-----------+----------+--------------+ PFV      Full                                                        +---------+---------------+---------+-----------+----------+--------------+  POP      Full           Yes      Yes                                 +---------+---------------+---------+-----------+----------+--------------+ PTV      Full                                                        +---------+---------------+---------+-----------+----------+--------------+ PERO     Full                                                        +---------+---------------+---------+-----------+----------+--------------+   +---------+---------------+---------+-----------+----------+--------------+ LEFT     CompressibilityPhasicitySpontaneityPropertiesThrombus Aging +---------+---------------+---------+-----------+----------+--------------+ CFV      Full           Yes      Yes                                 +---------+---------------+---------+-----------+----------+--------------+ SFJ      Full                                                        +---------+---------------+---------+-----------+----------+--------------+ FV Prox  Full                                                        +---------+---------------+---------+-----------+----------+--------------+ FV Mid   Full                                                         +---------+---------------+---------+-----------+----------+--------------+ FV DistalNone                                         Acute          +---------+---------------+---------+-----------+----------+--------------+ PFV      Full                                                        +---------+---------------+---------+-----------+----------+--------------+ POP      None                                         Acute          +---------+---------------+---------+-----------+----------+--------------+  PTV      None                                         Acute          +---------+---------------+---------+-----------+----------+--------------+ PERO     None                                         Acute          +---------+---------------+---------+-----------+----------+--------------+     Summary: RIGHT: - No evidence of deep vein thrombosis in the lower extremity. No indirect evidence of obstruction proximal to the inguinal ligament. - No cystic structure found in the popliteal fossa.  LEFT: - Findings consistent with acute deep vein thrombosis involving the left femoral vein, left popliteal vein, left posterior tibial veins, and left peroneal veins. - No cystic structure found in the popliteal fossa.  *See table(s) above for measurements and observations. Electronically signed by Lemar Livings MD on 04/19/2020 at 2:28:41 PM.    Final

## 2020-04-23 NOTE — Discharge Instructions (Addendum)
Information on my medicine - ELIQUIS (apixaban)  This medication education was reviewed with me or my healthcare representative as part of my discharge preparation.    Why was Eliquis prescribed for you? Eliquis was prescribed to treat blood clots that may have been found in the veins of your legs (deep vein thrombosis) or in your lungs (pulmonary embolism) and to reduce the risk of them occurring again.  What do You need to know about Eliquis ? The starting dose is 10 mg (two 5 mg tablets) taken TWICE daily for the FIRST SEVEN (7) DAYS, then on Thursday 04/26/20 the dose is reduced to ONE 5 mg tablet taken TWICE daily.  Eliquis may be taken with or without food.   Try to take the dose about the same time in the morning and in the evening. If you have difficulty swallowing the tablet whole please discuss with your pharmacist how to take the medication safely.  Take Eliquis exactly as prescribed and DO NOT stop taking Eliquis without talking to the doctor who prescribed the medication.  Stopping may increase your risk of developing a new blood clot.  Refill your prescription before you run out.  After discharge, you should have regular check-up appointments with your healthcare provider that is prescribing your Eliquis.    What do you do if you miss a dose? If a dose of ELIQUIS is not taken at the scheduled time, take it as soon as possible on the same day and twice-daily administration should be resumed. The dose should not be doubled to make up for a missed dose.  Important Safety Information A possible side effect of Eliquis is bleeding. You should call your healthcare provider right away if you experience any of the following: ? Bleeding from an injury or your nose that does not stop. ? Unusual colored urine (red or dark brown) or unusual colored stools (red or black). ? Unusual bruising for unknown reasons. ? A serious fall or if you hit your head (even if there is no  bleeding).  Some medicines may interact with Eliquis and might increase your risk of bleeding or clotting while on Eliquis. To help avoid this, consult your healthcare provider or pharmacist prior to using any new prescription or non-prescription medications, including herbals, vitamins, non-steroidal anti-inflammatory drugs (NSAIDs) and supplements.  This website has more information on Eliquis (apixaban): http://[email or web address].com/eliquis/home

## 2020-04-24 LAB — GLUCOSE, CAPILLARY
Glucose-Capillary: 113 mg/dL — ABNORMAL HIGH (ref 70–99)
Glucose-Capillary: 136 mg/dL — ABNORMAL HIGH (ref 70–99)
Glucose-Capillary: 317 mg/dL — ABNORMAL HIGH (ref 70–99)
Glucose-Capillary: 324 mg/dL — ABNORMAL HIGH (ref 70–99)
Glucose-Capillary: 201 mg/dL — ABNORMAL HIGH (ref 70–99)

## 2020-04-24 LAB — COMPREHENSIVE METABOLIC PANEL
ALT: 22 U/L (ref 0–44)
AST: 20 U/L (ref 15–41)
Albumin: 2.6 g/dL — ABNORMAL LOW (ref 3.5–5.0)
Alkaline Phosphatase: 47 U/L (ref 38–126)
Anion gap: 10 (ref 5–15)
BUN: 28 mg/dL — ABNORMAL HIGH (ref 8–23)
CO2: 35 mmol/L — ABNORMAL HIGH (ref 22–32)
Calcium: 8.9 mg/dL (ref 8.9–10.3)
Chloride: 96 mmol/L — ABNORMAL LOW (ref 98–111)
Creatinine, Ser: 1.31 mg/dL — ABNORMAL HIGH (ref 0.44–1.00)
GFR, Estimated: 44 mL/min — ABNORMAL LOW (ref >60)
Glucose, Bld: 137 mg/dL — ABNORMAL HIGH (ref 70–99)
Potassium: 3.8 mmol/L (ref 3.5–5.1)
Sodium: 141 mmol/L (ref 135–145)
Total Bilirubin: 0.8 mg/dL (ref 0.3–1.2)
Total Protein: 6 g/dL — ABNORMAL LOW (ref 6.5–8.1)

## 2020-04-24 LAB — CULTURE, BLOOD (ROUTINE X 2)
Culture: NO GROWTH
Culture: NO GROWTH
Special Requests: ADEQUATE
Special Requests: ADEQUATE

## 2020-04-24 LAB — CBC WITH DIFFERENTIAL/PLATELET
Abs Immature Granulocytes: 0.02 10*3/uL (ref 0.00–0.07)
Basophils Absolute: 0 10*3/uL (ref 0.0–0.1)
Basophils Relative: 0 %
Eosinophils Absolute: 0.1 10*3/uL (ref 0.0–0.5)
Eosinophils Relative: 1 %
HCT: 40.9 % (ref 36.0–46.0)
Hemoglobin: 12.1 g/dL (ref 12.0–15.0)
Immature Granulocytes: 0 %
Lymphocytes Relative: 11 %
Lymphs Abs: 1 10*3/uL (ref 0.7–4.0)
MCH: 22.6 pg — ABNORMAL LOW (ref 26.0–34.0)
MCHC: 29.6 g/dL — ABNORMAL LOW (ref 30.0–36.0)
MCV: 76.4 fL — ABNORMAL LOW (ref 80.0–100.0)
Monocytes Absolute: 0.3 10*3/uL (ref 0.1–1.0)
Monocytes Relative: 4 %
Neutro Abs: 7.2 10*3/uL (ref 1.7–7.7)
Neutrophils Relative %: 84 %
Platelets: 263 10*3/uL (ref 150–400)
RBC: 5.35 MIL/uL — ABNORMAL HIGH (ref 3.87–5.11)
RDW: 15.5 % (ref 11.5–15.5)
WBC: 8.6 10*3/uL (ref 4.0–10.5)
nRBC: 0 % (ref 0.0–0.2)

## 2020-04-24 LAB — BRAIN NATRIURETIC PEPTIDE: B Natriuretic Peptide: 24.9 pg/mL (ref 0.0–100.0)

## 2020-04-24 LAB — D-DIMER, QUANTITATIVE: D-Dimer, Quant: 1.94 ug/mL-FEU — ABNORMAL HIGH (ref 0.00–0.50)

## 2020-04-24 LAB — C-REACTIVE PROTEIN: CRP: 0.7 mg/dL (ref <1.0)

## 2020-04-24 LAB — MAGNESIUM: Magnesium: 2.3 mg/dL (ref 1.7–2.4)

## 2020-04-24 MED ORDER — INSULIN STARTER KIT- PEN NEEDLES (ENGLISH)
1.0000 | Freq: Once | Status: AC
  Administered 2020-04-24: 1
  Filled 2020-04-24: qty 1

## 2020-04-24 NOTE — Evaluation (Signed)
Physical Therapy Evaluation Patient Details Name: Chelsea Norton MRN: [id number] DOB: 1950-04-15 Today's Date: 04/24/2020   History of Present Illness  70 year old obese female presents to ED on 1/19 with progressive shortness of breath and hypoxia.  She was diagnosed with acute hypoxic respiratory failure due to COVID-19 pneumonia along with underlying OHS and OSA.  Required heated high flow and BiPAP, was treated with IV steroids remdesivir and Baricitinib by pulmonary critical care.  Stabilized and transferred to hospitalist service on 04/23/2020 on day 4 of her hospital stay.  Clinical Impression  Patient received in recliner. She reports she is feeling better, agreeable to PT assessment. She is received on 2 lpm O2 via [place]. Patient performed sit to stand transfer independently. Ambulated in room 50 feet with supervision. She had one lob which she was able to recover independently. Patient will continue to benefit from skilled PT while here to improve mobility, activity tolerance and safety.     Follow Up Recommendations No PT follow up    Equipment Recommendations  None recommended by PT    Recommendations for Other Services       Precautions / Restrictions Precautions Precautions: Fall Precaution Comments: mod fall Restrictions Weight Bearing Restrictions: No      Mobility  Bed Mobility               General bed mobility comments: not assessed, patient received in recliner and remained in recliner    Transfers Overall transfer level: Independent Equipment used: None                Ambulation/Gait Ambulation/Gait assistance: Supervision;Min assist Gait Distance (Feet): 50 Feet   Gait Pattern/deviations: Step-through pattern;Decreased stride length;Decreased step length - right;Decreased step length - left Gait velocity: decreased   General Gait Details: patient ambulated in room with supervision, had one loss of balance, she was able to catch herself.  Somewhat limited by lines.  Stairs            Wheelchair Mobility    Modified Rankin (Stroke Patients Only)       Balance Overall balance assessment: Mild deficits observed, not formally tested;Needs assistance Sitting-balance support: Feet supported Sitting balance-Leahy Scale: Normal     Standing balance support: No upper extremity supported;During functional activity Standing balance-Leahy Scale: Fair Standing balance comment: one loss of balance with ambulation, able to self correct.                             Pertinent Vitals/Pain Pain Assessment: No/denies pain    Home Living Family/patient expects to be discharged to:: Private residence Living Arrangements: Children Available Help at Discharge: Family;Available PRN/intermittently Type of Home: House Home Access: Stairs to enter   [employer] of Steps: 1 Home Layout: One level Home Equipment: None      Prior Function Level of Independence: Independent         Comments: Reports she will use an umbrella occasionally     Hand Dominance        Extremity/Trunk Assessment   Upper Extremity Assessment Upper Extremity Assessment: Defer to OT evaluation    Lower Extremity Assessment Lower Extremity Assessment: Overall WFL for tasks assessed    Cervical / Trunk Assessment Cervical / Trunk Assessment: Normal  Communication   Communication: No difficulties  Cognition Arousal/Alertness: Awake/alert Behavior During Therapy: WFL for tasks assessed/performed Overall Cognitive Status: Within Functional Limits for tasks assessed  General Comments      Exercises     Assessment/Plan    PT Assessment Patient needs continued PT services  PT Problem List Decreased strength;Decreased mobility;Decreased activity tolerance;Decreased balance;Cardiopulmonary status limiting activity       PT Treatment Interventions  Therapeutic activities;Therapeutic exercise;Gait training;Functional mobility training;Balance training;Patient/family education    PT Goals (Current goals can be found in the Care Plan section)  Acute Rehab PT Goals Patient Stated Goal: to return home PT Goal Formulation: With patient Time For Goal Achievement: 04/30/20 Potential to Achieve Goals: Good    Frequency Min 2X/week   Barriers to discharge        Co-evaluation               AM-PAC PT "6 Clicks" Mobility  Outcome Measure Help needed turning from your back to your side while in a flat bed without using bedrails?: None Help needed moving from lying on your back to sitting on the side of a flat bed without using bedrails?: A Little Help needed moving to and from a bed to a chair (including a wheelchair)?: None Help needed standing up from a chair using your arms (e.g., wheelchair or bedside chair)?: None Help needed to walk in hospital room?: A Little Help needed climbing 3-5 steps with a railing? : A Little 6 Click Score: 21    End of Session Equipment Utilized During Treatment: Oxygen Activity Tolerance: Patient tolerated treatment well Patient left: in chair;with call bell/phone within reach Nurse Communication: Mobility status PT Visit Diagnosis: Unsteadiness on feet (R26.81);Muscle weakness (generalized) (M62.81);Other abnormalities of gait and mobility (R26.89)    Time: 5009-3818 PT Time Calculation (min) (ACUTE ONLY): 23 min   Charges:   PT Evaluation $PT Eval Moderate Complexity: 1 Mod PT Treatments $Gait Training: 8-22 mins        Anav Lammert, PT, GCS 04/24/20,10:15 AM

## 2020-04-24 NOTE — Progress Notes (Addendum)
PROGRESS NOTE                                                                                                                                                                                                             Patient Demographics:    Chelsea Norton, is a 70 y.o. female, DOB - [birth year], RUE:[id number]  Outpatient Primary MD for the patient is Chelsea Norton    LOS - 5  Admit date - 04/18/2020    Chief Complaint  Patient presents with  . Shortness of Breath       Brief Narrative (HPI from H&P)  70 year old obese female presents to ED on 1/19 with progressive shortness of breath and hypoxia.  She was diagnosed with acute hypoxic respiratory failure due to COVID-19 pneumonia along with underlying OHS and OSA.  Required heated high flow and BiPAP, was treated with IV steroids remdesivir and Baricitinib by pulmonary critical care.  Stabilized and transferred to hospitalist service on 04/23/2020 on day 4 of her hospital stay.   Subjective:   Patient in bed, appears comfortable, denies any headache, no fever, no chest pain or pressure, no shortness of breath , no abdominal pain. No focal weakness.   Assessment  & Plan :     1. Acute Hypoxic Resp. Failure due to Acute Covid 19 Viral Pneumonitis during the ongoing 2020 Covid 19 Pandemic - she has had 2 mRNA shots, there was also element of underlying OHS and OSA to her respiratory failure.  She was initially treated with heated high flow and BiPAP, now stable on nasal cannula.  She has received IV steroids, Remdesivir and Baricitinib.  Clinically much better, on IV Decadron and Baricitinib, continue to titrate down oxygen from 4 L to 2 L increase activity if stable likely home on 04/25/2020 with home health PT with a rescue inhaler and PCP follow-up.  Encouraged the patient to sit up in chair in the daytime use I-S and flutter valve for pulmonary toiletry and then prone  in bed when at night.  Will advance activity and titrate down oxygen as possible.   Recent Labs  Lab  0000 04/18/20 2313 04/18/20 2316 04/19/20 0225 04/19/20 0235 04/19/20 1478 04/19/20 1102 04/20/20 0319 04/21/20 1708 04/22/20 0217 04/23/20 0418 04/24/20 0722  WBC   < >  --  8.1  --   --   --   --  8.6 5.2 5.6 5.7 8.6  HGB  --   --  14.2  --   --    < >  --  13.2 12.6 12.5 12.3 12.1  HCT  --   --  48.0*  --   --    < >  --  44.8 45.2 43.4 40.8 40.9  PLT   < >  --  238  --   --   --   --  206 224 221 230 263  CRP  --   --   --   --  6.2*  --   --   --   --   --   --  0.7  BNP  --   --   --   --   --   --  306.9*  --   --   --   --  24.9  DDIMER  --   --   --   --  3.72*  --   --   --   --   --   --  1.94*  PROCALCITON  --   --   --   --  <0.10  --   --   --   --   --   --   --   AST  --   --  46*  --   --   --   --   --   --   --   --  20  ALT  --   --  41  --   --   --   --   --   --   --   --  22  ALKPHOS  --   --  74  --   --   --   --   --   --   --   --  47  BILITOT  --   --  0.7  --   --   --   --   --   --   --   --  0.8  ALBUMIN  --   --  3.2*  --   --   --   --   --   --   --   --  2.6*  INR  --   --  1.1  --   --   --   --   --   --   --   --   --   LATICACIDVEN  --   --  1.1 0.9  --   --   --   --   --   --   --   --   SARSCOV2NAA  --  POSITIVE*  --   --   --   --   --   --   --   --   --   --    < > = values in this interval not displayed.    Encephalopathy secondary to hypercarbic state: Resolved. CT head clear  Acute LLE DVT: on Eliquis.  Minimum 3 months.  Volume overload due to acute diastolic heart failure - echo noted with preserved EF of 55%, has been adequately diuresed will monitor use as needed Lasix as needed   Acute Renal Injury:  Due to ATN.  Stable.  Demand ischemia:  Due to demand mismatch from hypoxia, echo shows preserved EF with no wall motion abnormality.  Blood pressure is stable will add low-dose  beta-blocker and Lipitor, already on  anticoagulation for blood clot.  Morbid obesity BMI 44 with OSA.  Follow with PCP for weight loss, she used to have a BiPAP several years ago but has since lost the device, will require outpatient sleep study and pulmonary follow-up.  May benefit from nighttime oxygen upon discharge.  Diabetes:  Poor outpatient control due to hyperglycemia A1c of 10, on long-acting insulin and sliding scale, ordered DM and insulin education on 04/23/20.  She was on Glucophage and Amaryl at home may require insulin upon discharge.  Lab Results  Component Value Date   HGBA1C 10.1 (H) 04/20/2020   CBG (last 3)  Recent Labs    04/23/20 2113 04/24/20 0457 04/24/20 0747  GLUCAP 150* 113* 136*          Condition - Extremely Guarded  Family Communication  : daughter Modesto Charon [phone] on 04/23/2020 - HHPT  Code Status :  Full  Consults  :  PCCM  Procedures  :    TTE - 1. Left ventricular ejection fraction, by estimation, is 55 to 60%. The left ventricle has normal function. The left ventricle has no regional wall motion abnormalities. There is moderate concentric left ventricular hypertrophy. Left ventricular diastolic parameters are consistent with Grade II diastolic dysfunction (pseudonormalization). Elevated left ventricular end-diastolic pressure.  2. Right ventricular systolic function is normal. The right ventricular size is normal. There is mildly elevated pulmonary artery systolic pressure.  3. Left atrial size was mildly dilated.  4. Right atrial size was mildly dilated.  5. The mitral valve is normal in structure. Trivial mitral valve regurgitation. No evidence of mitral stenosis.  6. The aortic valve is tricuspid. There is mild calcification of the aortic valve. Aortic valve regurgitation is not visualized. Mild aortic valve sclerosis is present, with no evidence of aortic valve stenosis.  7. Aortic dilatation noted. There is mild to moderate dilatation of the ascending aorta, measuring 42  mm.  8. The inferior vena cava is dilated in size with >50% respiratory variability, suggesting right atrial pressure of 8 mmHg.   Leg [name] - Acute L.Leg DVT  CT head.  Nonacute..  PUD Prophylaxis : none  Disposition Plan  :    Status is: Inpatient  Remains inpatient appropriate because:IV treatments appropriate due to intensity of illness or inability to take PO   Dispo: The patient is from: Home              Anticipated d/c is to: Home              Anticipated d/c date is: 3 days              Patient currently is not medically stable to d/c.   Difficult to place patient No   DVT Prophylaxis  :  Eliquis  Lab Results  Component Value Date   PLT 263 04/24/2020    Diet :  Diet Order            Diet Carb Modified Fluid consistency: Thin; Room service appropriate? Yes  Diet effective now                  Inpatient Medications  Scheduled Meds: . apixaban  10 mg Oral BID   Followed by  . [START ON 04/26/2020] apixaban  5 mg Oral BID  . atorvastatin  40 mg Oral Daily  . baricitinib  2 mg Oral Daily  . carvedilol  3.125 mg Oral BID WC  . chlorhexidine  15 mL Mouth Rinse BID  . Chlorhexidine Gluconate Cloth  6 each Topical Daily  . dexamethasone (DECADRON) injection  6 mg Intravenous Q24H  . insulin aspart  0-15 Units Subcutaneous TID WC  . insulin aspart  0-5 Units Subcutaneous QHS  . insulin aspart  5 Units Subcutaneous TID WC  . insulin detemir  10 Units Subcutaneous Daily  . mouth rinse  15 mL Mouth Rinse q12n4p   Continuous Infusions: PRN Meds:.polyethylene glycol  Antibiotics  :    Anti-infectives (From admission, onward)   Start     Dose/Rate Route Frequency Ordered Stop   04/20/20 1000  remdesivir 100 mg in sodium chloride 0.9 % 100 mL IVPB       "Followed by" Linked Group Details   100 mg 200 mL/hr over 30 Minutes Intravenous Daily 04/19/20 0055 04/23/20 0958   04/19/20 0200  remdesivir 200 mg in sodium chloride 0.9% 250 mL IVPB       "Followed by"  Linked Group Details   200 mg 580 mL/hr over 30 Minutes Intravenous Once 04/19/20 0055 04/19/20 0343   04/18/20 2345  azithromycin (ZITHROMAX) 500 mg in sodium chloride 0.9 % 250 mL IVPB  Status:  Discontinued        500 mg 250 mL/hr over 60 Minutes Intravenous  Once 04/18/20 2343 04/19/20 0049       Time Spent in minutes  30   Susa Raring M.D on 04/24/2020 at 9:16 AM  To page go to [email or web address]   [facility] -  Office  [phone]   See all Orders from today for further details    Objective:   Vitals:   04/24/20 0300 04/24/20 0400 04/24/20 0454 04/24/20 0749  BP:  135/75 125/75 124/67  Pulse: 74 80 74 81  Resp: 17 (!) 23 17 18   Temp:  98.1 F (36.7 C) 98 F (36.7 C)   TempSrc:  Oral Oral   SpO2: 93% 93% 95%   Weight:      Height:        Wt Readings from Last 3 Encounters:  04/23/20 116.7 kg     Intake/Output Summary (Last 24 hours) at 04/24/2020 0916 Last data filed at 04/24/2020 0500 Gross Norton 24 hour  Intake 1320 ml  Output --  Net 1320 ml     Physical Exam  Awake Alert, No new F.N deficits, Normal affect [place].AT,PERRAL Supple Neck,No JVD, No cervical lymphadenopathy appriciated.  Symmetrical Chest wall movement, Good air movement bilaterally, CTAB RRR,No Gallops, Rubs or new Murmurs, No Parasternal Heave +ve B.Sounds, Abd Soft, No tenderness, No organomegaly appriciated, No rebound - guarding or rigidity. No Cyanosis, Clubbing or edema, No new Rash or bruise       Data Review:    CBC Recent Labs  Lab 04/18/20 2316 04/19/20 0741 04/20/20 0319 04/21/20 1708 04/22/20 0217 04/23/20 0418 04/24/20 0722  WBC 8.1  --  8.6 5.2 5.6 5.7 8.6  HGB 14.2   < > 13.2 12.6 12.5 12.3 12.1  HCT 48.0*   < > 44.8 45.2 43.4 40.8 40.9  PLT 238  --  206 224 221 230 263  MCV 77.9*  --  78.6* 78.1* 77.8* 76.0* 76.4*  MCH 23.1*  --  23.2* 21.8* 22.4* 22.9* 22.6*  MCHC 29.6*  --  29.5* 27.9* 28.8* 30.1 29.6*  RDW 17.2*  --  16.3* 15.9* 15.8* 15.6* 15.5   LYMPHSABS 2.0  --   --   --   --   --  1.0  MONOABS 0.6  --   --   --   --   --  0.3  EOSABS 0.0  --   --   --   --   --  0.1  BASOSABS 0.0  --   --   --   --   --  0.0   < > = values in this interval not displayed.    Recent Labs  Lab 04/18/20 2316 04/19/20 0225 04/19/20 0235 04/19/20 0741 04/19/20 1102 04/20/20 0319 04/21/20 1708 04/22/20 0217 04/23/20 0418 04/24/20 0722  NA 142  --   --    < >  --  143 137 140 139 141  K 3.7  --   --    < >  --  3.7 4.4 4.3 3.5 3.8  CL 99  --   --   --   --  100 98 100 98 96*  CO2 31  --   --   --   --  31 30 32 32 35*  GLUCOSE 265*  --   --   --   --  144* 347* 211* 108* 137*  BUN 15  --   --   --   --  32* 35* 36* 32* 28*  CREATININE 1.29*  --   --   --   --  1.54* 1.26* 1.36* 1.20* 1.31*  CALCIUM 9.2  --   --   --   --  8.8* 8.5* 8.7* 8.7* 8.9  AST 46*  --   --   --   --   --   --   --   --  20  ALT 41  --   --   --   --   --   --   --   --  22  ALKPHOS 74  --   --   --   --   --   --   --   --  47  BILITOT 0.7  --   --   --   --   --   --   --   --  0.8  ALBUMIN 3.2*  --   --   --   --   --   --   --   --  2.6*  MG  --   --   --   --   --  2.0  --   --   --  2.3  CRP  --   --  6.2*  --   --   --   --   --   --  0.7  DDIMER  --   --  3.72*  --   --   --   --   --   --  1.94*  PROCALCITON  --   --  <0.10  --   --   --   --   --   --   --   LATICACIDVEN 1.1 0.9  --   --   --   --   --   --   --   --   INR 1.1  --   --   --   --   --   --   --   --   --   HGBA1C  --   --   --   --   --  10.1*  --   --   --   --  BNP  --   --   --   --  306.9*  --   --   --   --  24.9   < > = values in this interval not displayed.    ------------------------------------------------------------------------------------------------------------------ No results for input(s): CHOL, HDL, LDLCALC, TRIG, CHOLHDL, LDLDIRECT in the last 72 hours.  Lab Results  Component Value Date   HGBA1C 10.1 (H) 04/20/2020    ------------------------------------------------------------------------------------------------------------------ No results for input(s): TSH, T4TOTAL, T3FREE, THYROIDAB in the last 72 hours.  Invalid input(s): FREET3  Cardiac Enzymes No results for input(s): CKMB, TROPONINI, MYOGLOBIN in the last 168 hours.  Invalid input(s): CK ------------------------------------------------------------------------------------------------------------------    Component Value Date/Time   BNP 24.9 04/24/2020 1610    Micro Results Recent Results (from the past 240 hour(s))  Culture, blood (Routine x 2)     Status: None (Preliminary result)   Collection Time: 04/18/20 10:15 PM   Specimen: BLOOD LEFT HAND  Result Value Ref Range Status   Specimen Description BLOOD LEFT HAND  Final   Special Requests   Final    BOTTLES DRAWN AEROBIC AND ANAEROBIC Blood Culture adequate volume   Culture   Final    NO GROWTH 4 DAYS Performed at [facility], [address] [place], [place] [address]    Report Status PENDING  Incomplete  Resp Panel by RT-PCR (Flu A&B, Covid) Nasopharyngeal Swab     Status: Abnormal   Collection Time: 04/18/20 11:13 PM   Specimen: Nasopharyngeal Swab; Nasopharyngeal(NP) swabs in vial transport medium  Result Value Ref Range Status   SARS Coronavirus 2 by RT PCR POSITIVE (A) NEGATIVE Final    Comment: RESULT CALLED TO, READ BACK BY AND VERIFIED WITH: E SULLIVAN RN 04/19/20 0046 JDW (NOTE) SARS-CoV-2 target nucleic acids are DETECTED.  The SARS-CoV-2 RNA is generally detectable in upper respiratory specimens during the acute phase of infection. Positive results are indicative of the presence of the identified virus, but do not rule out bacterial infection or co-infection with other pathogens not detected by the test. Clinical correlation with patient history and other diagnostic information is necessary to determine patient infection status. The expected result is  Negative.  Fact Sheet for Patients: BloggerCourse.com  Fact Sheet for Healthcare Providers: SeriousBroker.it  This test is not yet approved or cleared by the [place] FDA and  has been authorized for detection and/or diagnosis of SARS-CoV-2 by FDA under an Emergency Use Authorization (EUA).  This EUA will remain in effect (meaning this test can be [name] ed) for the duration of  the COVID-19 declaration under Section 564(b)(1) of the Act, 21 U.S.C. section 360bbb-3(b)(1), unless the authorization is terminated or revoked sooner.     Influenza A by PCR NEGATIVE NEGATIVE Final   Influenza B by PCR NEGATIVE NEGATIVE Final    Comment: (NOTE) The Xpert Xpress SARS-CoV-2/FLU/RSV plus assay is intended as an aid in the diagnosis of influenza from Nasopharyngeal swab specimens and should not be used as a sole basis for treatment. Nasal washings and aspirates are unacceptable for Xpert Xpress SARS-CoV-2/FLU/RSV testing.  Fact Sheet for Patients: BloggerCourse.com  Fact Sheet for Healthcare Providers: SeriousBroker.it  This test is not yet approved or cleared by the [place] FDA and has been authorized for detection and/or diagnosis of SARS-CoV-2 by FDA under an Emergency Use Authorization (EUA). This EUA will remain in effect (meaning this test can be used) for the duration of the COVID-19 declaration under Section 564(b)(1) of the Act, 21 U.S.C.  section 360bbb-3(b)(1), unless the authorization is terminated or revoked.  Performed at [facility], [address] [place], [place] [address]   Culture, blood (Routine x 2)     Status: None (Preliminary result)   Collection Time: 04/19/20  2:35 AM   Specimen: BLOOD LEFT HAND  Result Value Ref Range Status   Specimen Description BLOOD LEFT HAND  Final   Special Requests   Final    BOTTLES DRAWN AEROBIC AND ANAEROBIC Blood  Culture adequate volume   Culture   Final    NO GROWTH 4 DAYS Performed at [facility], [address], [place], [place] [address]    Report Status PENDING  Incomplete  MRSA PCR Screening     Status: None   Collection Time: 04/19/20  8:25 AM   Specimen: Nasal Mucosa; Nasopharyngeal  Result Value Ref Range Status   MRSA by PCR NEGATIVE NEGATIVE Final    Comment:        The GeneXpert MRSA Assay (FDA approved for NASAL specimens only), is one component of a comprehensive MRSA colonization surveillance program. It is not intended to diagnose MRSA infection nor to guide or monitor treatment for MRSA infections. Performed at [facility], [address] [place], [place] [address]     Radiology Reports DG Chest 2 View  Result Date: 04/18/2020 CLINICAL DATA:  Shortness of breath. EXAM: CHEST - 2 VIEW COMPARISON:  None. FINDINGS: Moderate to marked severity patchy infiltrates are seen within the bilateral lung bases and mid lung fields, bilaterally. There is no evidence of a pleural effusion or pneumothorax. The cardiac silhouette is markedly enlarged. Widening of the mediastinum is also seen which may be positional in nature. Degenerative changes are seen throughout the thoracic spine. IMPRESSION: 1. Cardiomegaly with moderate to marked severity bilateral infiltrates. Electronically Signed   By: Aram Candela M.D.   On: 04/18/2020 23:32   CT HEAD WO CONTRAST  Result Date: 04/19/2020 CLINICAL DATA:  Mental status change, persistent or worsening. Shortness of breath on BiPAP EXAM: CT HEAD WITHOUT CONTRAST TECHNIQUE: Contiguous axial images were obtained from the base of the skull through the vertex without intravenous contrast. COMPARISON:  None. FINDINGS: Brain: No evidence of acute infarction, hemorrhage, hydrocephalus, extra-axial collection or mass lesion/mass effect. Patchy low-density areas in the bilateral cerebral white matter the out detected swelling, likely chronic  small vessel disease related to patient's history of hypertension. Some of these areas are subcortical and very low-density, likely dilated perivascular spaces. Vascular: No hyperdense vessel or unexpected calcification. Skull: Normal. Negative for fracture or focal lesion. Sinuses/Orbits: No acute finding. IMPRESSION: 1. No acute finding. 2. Chronic white matter disease. Electronically Signed   By: Marnee Spring M.D.   On: 04/19/2020 07:11   ECHOCARDIOGRAM COMPLETE  Result Date: 04/19/2020    ECHOCARDIOGRAM REPORT   Patient Name:   Chelsea Norton Date of Exam: 04/19/2020 Medical Rec #:  [id number]        Height:       64.0 in Accession #:    [id number]       Weight:       247.6 lb Date of Birth:  December 09, 1950       BSA:          2.142 m Patient Age:    69 years         BP:           120/71 mmHg Patient Gender: F  HR:           92 bpm. Exam Location:  Inpatient Procedure: 2D Echo, Cardiac Doppler and Color Doppler Indications:    I50.23 Acute on chronic systolic (congestive) heart failure  History:        Patient has no prior history of Echocardiogram examinations.                 Risk Factors:Hypertension. COVID-19 Positive.  Sonographer:    Tiffany Dance Referring Phys: [id number] Tobey Grim IMPRESSIONS  1. Left ventricular ejection fraction, by estimation, is 55 to 60%. The left ventricle has normal function. The left ventricle has no regional wall motion abnormalities. There is moderate concentric left ventricular hypertrophy. Left ventricular diastolic parameters are consistent with Grade II diastolic dysfunction (pseudonormalization). Elevated left ventricular end-diastolic pressure.  2. Right ventricular systolic function is normal. The right ventricular size is normal. There is mildly elevated pulmonary artery systolic pressure.  3. Left atrial size was mildly dilated.  4. Right atrial size was mildly dilated.  5. The mitral valve is normal in structure. Trivial mitral valve  regurgitation. No evidence of mitral stenosis.  6. The aortic valve is tricuspid. There is mild calcification of the aortic valve. Aortic valve regurgitation is not visualized. Mild aortic valve sclerosis is present, with no evidence of aortic valve stenosis.  7. Aortic dilatation noted. There is mild to moderate dilatation of the ascending aorta, measuring 42 mm.  8. The inferior vena cava is dilated in size with >50% respiratory variability, suggesting right atrial pressure of 8 mmHg. Comparison(s): No prior Echocardiogram. FINDINGS  Left Ventricle: Left ventricular ejection fraction, by estimation, is 55 to 60%. The left ventricle has normal function. The left ventricle has no regional wall motion abnormalities. The left ventricular internal cavity size was normal in size. There is  moderate concentric left ventricular hypertrophy. Left ventricular diastolic parameters are consistent with Grade II diastolic dysfunction (pseudonormalization). Elevated left ventricular end-diastolic pressure. Right Ventricle: The right ventricular size is normal. Right vetricular wall thickness was not well visualized. Right ventricular systolic function is normal. There is mildly elevated pulmonary artery systolic pressure. The tricuspid regurgitant velocity  is 2.73 m/s, and with an assumed right atrial pressure of 8 mmHg, the estimated right ventricular systolic pressure is 37.8 mmHg. Left Atrium: Left atrial size was mildly dilated. Right Atrium: Right atrial size was mildly dilated. Pericardium: Trivial pericardial effusion is present. Mitral Valve: The mitral valve is normal in structure. Mild mitral annular calcification. Trivial mitral valve regurgitation. No evidence of mitral valve stenosis. Tricuspid Valve: The tricuspid valve is normal in structure. Tricuspid valve regurgitation is mild . No evidence of tricuspid stenosis. Aortic Valve: The aortic valve is tricuspid. There is mild calcification of the aortic valve.  Aortic valve regurgitation is not visualized. Mild aortic valve sclerosis is present, with no evidence of aortic valve stenosis. Pulmonic Valve: The pulmonic valve was not well visualized. Pulmonic valve regurgitation is mild. No evidence of pulmonic stenosis. Aorta: Aortic dilatation noted. There is mild to moderate dilatation of the ascending aorta, measuring 42 mm. Venous: The inferior vena cava is dilated in size with greater than 50% respiratory variability, suggesting right atrial pressure of 8 mmHg. IAS/Shunts: The atrial septum is grossly normal.  LEFT VENTRICLE PLAX 2D LVIDd:         3.50 cm  Diastology LVIDs:         2.40 cm  LV e' medial:    4.35 cm/s LV PW:  1.50 cm  LV E/e' medial:  16.6 LV IVS:        1.50 cm  LV e' lateral:   4.68 cm/s LVOT diam:     2.00 cm  LV E/e' lateral: 15.5 LV SV:         68 LV SV Index:   32 LVOT Area:     3.14 cm  RIGHT VENTRICLE             IVC RV Basal diam:  2.70 cm     IVC diam: 2.90 cm RV S prime:     11.40 cm/s TAPSE (M-mode): 2.0 cm LEFT ATRIUM             Index       RIGHT ATRIUM           Index LA diam:        3.60 cm 1.68 cm/m  RA Area:     20.40 cm LA Vol (A2C):   49.2 ml 22.97 ml/m RA Volume:   66.40 ml  31.00 ml/m LA Vol (A4C):   67.3 ml 31.42 ml/m LA Biplane Vol: 57.8 ml 26.98 ml/m  AORTIC VALVE LVOT Vmax:   117.00 cm/s LVOT Vmean:  74.100 cm/s LVOT VTI:    0.218 m  AORTA Ao Root diam: 3.50 cm Ao Asc diam:  4.20 cm MITRAL VALVE               TRICUSPID VALVE MV Area (PHT): 3.03 cm    TR Peak grad:   29.8 mmHg MV Decel Time: 250 msec    TR Vmax:        273.00 cm/s MV E velocity: 72.40 cm/s MV A velocity: 67.70 cm/s  SHUNTS MV E/A ratio:  1.07        Systemic VTI:  0.22 m                            Systemic Diam: 2.00 cm Jodelle RedBridgette Christopher MD Electronically signed by Jodelle RedBridgette Christopher MD Signature Date/Time: 04/19/2020/2:56:42 PM    Final    VAS US LOWER EXTREMITY VENOUS (DVT)  Result Date: 04/19/2020  Lower Venous DVT Study Other  Indications: Covid, D-dimer. Risk Factors: None identified. Comparison Study: No previous exams Performing Technologist: Clint GuyLisa Gibson RVT  Examination Guidelines: A complete evaluation includes B-mode imaging, spectral Doppler, color Doppler, and power Doppler as needed of all accessible portions of each vessel. Bilateral testing is considered an integral part of a complete examination. Limited examinations for reoccurring indications may be performed as noted. The reflux portion of the exam is performed with the patient in reverse Trendelenburg.  +---------+---------------+---------+-----------+----------+--------------+ RIGHT    CompressibilityPhasicitySpontaneityPropertiesThrombus Aging +---------+---------------+---------+-----------+----------+--------------+ CFV      Full           Yes      Yes                                 +---------+---------------+---------+-----------+----------+--------------+ SFJ      Full                                                        +---------+---------------+---------+-----------+----------+--------------+ FV Prox  Full                                                        +---------+---------------+---------+-----------+----------+--------------+  FV Mid   Full                                                        +---------+---------------+---------+-----------+----------+--------------+ FV DistalFull                                                        +---------+---------------+---------+-----------+----------+--------------+ PFV      Full                                                        +---------+---------------+---------+-----------+----------+--------------+ POP      Full           Yes      Yes                                 +---------+---------------+---------+-----------+----------+--------------+ PTV      Full                                                         +---------+---------------+---------+-----------+----------+--------------+ PERO     Full                                                        +---------+---------------+---------+-----------+----------+--------------+   +---------+---------------+---------+-----------+----------+--------------+ LEFT     CompressibilityPhasicitySpontaneityPropertiesThrombus Aging +---------+---------------+---------+-----------+----------+--------------+ CFV      Full           Yes      Yes                                 +---------+---------------+---------+-----------+----------+--------------+ SFJ      Full                                                        +---------+---------------+---------+-----------+----------+--------------+ FV Prox  Full                                                        +---------+---------------+---------+-----------+----------+--------------+ FV Mid   Full                                                        +---------+---------------+---------+-----------+----------+--------------+  FV DistalNone                                         Acute          +---------+---------------+---------+-----------+----------+--------------+ PFV      Full                                                        +---------+---------------+---------+-----------+----------+--------------+ POP      None                                         Acute          +---------+---------------+---------+-----------+----------+--------------+ PTV      None                                         Acute          +---------+---------------+---------+-----------+----------+--------------+ PERO     None                                         Acute          +---------+---------------+---------+-----------+----------+--------------+     Summary: RIGHT: - No evidence of deep vein thrombosis in the lower extremity. No indirect evidence of obstruction  proximal to the inguinal ligament. - No cystic structure found in the popliteal fossa.  LEFT: - Findings consistent with acute deep vein thrombosis involving the left femoral vein, left popliteal vein, left posterior tibial veins, and left peroneal veins. - No cystic structure found in the popliteal fossa.  *See table(s) above for measurements and observations. Electronically signed by Lemar LivingsBrandon Cain MD on 04/19/2020 at 2:28:41 PM.    Final

## 2020-04-24 NOTE — Evaluation (Signed)
Occupational Therapy Evaluation Patient Details Name: Chelsea Norton MRN: [id number] DOB: 09-Jul-1950 Today's Date: 04/24/2020    History of Present Illness 70 year old obese female presents to ED on 1/19 with progressive shortness of breath and hypoxia.  She was diagnosed with acute hypoxic respiratory failure due to COVID-19 pneumonia along with underlying OHS and OSA.  Required heated high flow and BiPAP, was treated with IV steroids remdesivir and Baricitinib by pulmonary critical care.  Stabilized and transferred to hospitalist service on 04/23/2020 on day 4 of her hospital stay.   Clinical Impression   PTA, pt lives with daughter and young grandchildren. Pt reports being Independent with ADLs, IADLs and mobility. Pt presents with mild deficits in cardiopulmonary tolerance and dynamic standing balance. Pt overall Independent for sit to stand transfers without AD from recliner and toilet. Pt requires Setup for UB/LB ADLs at this time. Educated on energy conservation strategies, use of BSC as shower chair and benefits of having a pulse oximeter at home. Anticipate pt to progress well functionally at acute level and no skilled OT services needed at discharge. Plan to provide UE HEP, reinforce energy conservation strategies and improve overall endurance at acute level.  Pt received on 2 L O2, 94% at rest and 93% standing at sink during ADLs. Trialed remainder of session on RA with SpO2 at 85% after returning to chair from bathroom. Recovered to >90% on RA within 1.5 minutes of seated rest break. Placed ear probe on pt with SpO2 at 97% on RA at end of session.     Follow Up Recommendations  No OT follow up;Supervision - Intermittent    Equipment Recommendations  3 in 1 bedside commode (if pt does not already have - reports she may have one in storage)    Recommendations for Other Services       Precautions / Restrictions Precautions Precautions: Fall Precaution Comments: mod fall. monitor  O2 Restrictions Weight Bearing Restrictions: No      Mobility Bed Mobility               General bed mobility comments: up in chair    Transfers Overall transfer level: Independent Equipment used: None             General transfer comment: Able to complete sit to stand from recliner and toilet without AD or physical assistance. Supervision for mobility with no overt LOB    Balance Overall balance assessment: Mild deficits observed, not formally tested Sitting-balance support: Feet supported Sitting balance-Leahy Scale: Normal     Standing balance support: No upper extremity supported;During functional activity Standing balance-Leahy Scale: Fair Standing balance comment: one loss of balance with ambulation, able to self correct.                           ADL either performed or assessed with clinical judgement   ADL Overall ADL's : Needs assistance/impaired Eating/Feeding: Independent;Sitting   Grooming: Standing;Oral care;Set up Grooming Details (indicate cue type and reason): Setup to brush teeth standing at sink, as well as wash face. Able to stand > 5 minutes Upper Body Bathing: Set up;Sitting   Lower Body Bathing: Set up;Sit to/from stand   Upper Body Dressing : Set up;Sitting   Lower Body Dressing: Set up;Sit to/from stand   Toilet Transfer: Ambulation;Regular Network engineer Details (indicate cue type and reason): for safety, no overt LOB Toileting- Clothing Manipulation and Hygiene: Set up;Sit to/from stand Therapist, nutritional  Details (indicate cue type and reason): Setup for toileting task, no assist for clothing mgmt or hygiene     Functional mobility during ADLs: Supervision/safety General ADL Comments: Limited by mild unsteadiness and endurance deficits. Able to progress to RA during session     Vision Baseline Vision/History: Wears glasses Wears Glasses: Reading only Patient Visual  Report: No change from baseline Vision Assessment?: No apparent visual deficits     Perception     Praxis      Pertinent Vitals/Pain Pain Assessment: No/denies pain     Hand Dominance Right   Extremity/Trunk Assessment Upper Extremity Assessment Upper Extremity Assessment: Overall WFL for tasks assessed   Lower Extremity Assessment Lower Extremity Assessment: Defer to PT evaluation   Cervical / Trunk Assessment Cervical / Trunk Assessment: Normal   Communication Communication Communication: No difficulties   Cognition Arousal/Alertness: Awake/alert Behavior During Therapy: WFL for tasks assessed/performed Overall Cognitive Status: Within Functional Limits for tasks assessed                                 General Comments: A&Ox4, pleasant and follows all directions. One instance of confusion when attempting to brush teeth (walks passed sink to go into bathroom looking for sink). But pt laughed and easily directed back to task   General Comments  Pt received on 2 L O2, SpO2 94% at rest (use of personal pulse ox as pt not on O2 monitoring on entry - collaborated with NT for provision of O2 probe and OT plaed on pt ear). While completing standing ADLs at sink, SpO2 93% on 2 L O2. Trialed toileting task and mobility back to chair on RA, desats to 85%. With cues for pursed lip breathing and application of ear probe, SpO2 97% on RA at rest within 1.5 min of rest. Educated on use of BSC as shower chair, energy conservation tips and recommended purchase of pulse oximeter for home use    Exercises     Shoulder Instructions      Home Living Family/patient expects to be discharged to:: Private residence Living Arrangements: Children;Other (Comment) (3 young grandchildren) Available Help at Discharge: Family;Available PRN/intermittently Type of Home: House Home Access: Stairs to enter [employer] of Steps: 1   Home Layout: One level     Bathroom  Shower/Tub: Chief Strategy Officer: Standard     Home Equipment: Bedside commode;Cane - single point   Additional Comments: Reports having this DME in storage but does not use      Prior Functioning/Environment Level of Independence: Independent        Comments: Reports she will use an umbrella occasionally with mobility. Able to complete ADLs, IADLs without issues        OT Problem List: Decreased activity tolerance;Impaired balance (sitting and/or standing);Cardiopulmonary status limiting activity;Decreased knowledge of use of DME or AE      OT Treatment/Interventions: Self-care/ADL training;Therapeutic exercise;Energy conservation;DME and/or AE instruction;Therapeutic activities;Patient/family education    OT Goals(Current goals can be found in the care plan section) Acute Rehab OT Goals Patient Stated Goal: to return home OT Goal Formulation: With patient Time For Goal Achievement: 05/08/20 Potential to Achieve Goals: Good ADL Goals Pt Will Perform Lower Body Dressing: Independently;sitting/lateral leans;sit to/from stand Pt Will Transfer to Toilet: Independently;ambulating;regular height toilet Pt/caregiver will Perform Home Exercise Program: Increased strength;Both right and left upper extremity;With theraband;Independently;With written HEP provided Additional ADL Goal #1: Pt to verbalize  at least 2 energy conservation strategies to implement during ADLs/IADLs Additional ADL Goal #2: Pt to independently monitor SpO2 and implement pursed lip breathing to maintain O2 > 88%  OT Frequency: Min 2X/week   Barriers to D/C:            Co-evaluation              AM-PAC OT "6 Clicks" Daily Activity     Outcome Measure Help from another person eating meals?: None Help from another person taking care of personal grooming?: A Little Help from another person toileting, which includes using toliet, bedpan, or urinal?: A Little Help from another person bathing  (including washing, rinsing, drying)?: A Little Help from another person to put on and taking off regular upper body clothing?: A Little Help from another person to put on and taking off regular lower body clothing?: A Little 6 Click Score: 19   End of Session Equipment Utilized During Treatment: Oxygen Nurse Communication: Mobility status;Other (comment) (O2)  Activity Tolerance: Patient tolerated treatment well Patient left: in chair;with call bell/phone within reach  OT Visit Diagnosis: Unsteadiness on feet (R26.81);Other (comment) (decreased cardiopulmonary tolerance)                Time: 1660-6301 OT Time Calculation (min): 31 min Charges:  OT General Charges $OT Visit: 1 Visit OT Evaluation $OT Eval Moderate Complexity: 1 Mod OT Treatments $Self Care/Home Management : 8-22 mins  Lorre Munroe, OTR/L  Lorre Munroe 04/24/2020, 1:21 PM

## 2020-04-24 NOTE — Progress Notes (Signed)
Starter insulin kit was given to pt. And explained in great detail. Pt. Demonstrated correct usage of starter equipment 4 times. All questions and concerns were addressed. Pt. States she feels comfortable/confident in the procedure.

## 2020-04-24 NOTE — Plan of Care (Signed)
  Problem: Education: Goal: Knowledge of risk factors and measures for prevention of condition will improve Outcome: Progressing   

## 2020-04-25 ENCOUNTER — Other Ambulatory Visit (HOSPITAL_COMMUNITY): Payer: Self-pay | Admitting: Internal Medicine

## 2020-04-25 DIAGNOSIS — U071 COVID-19: Principal | ICD-10-CM

## 2020-04-25 DIAGNOSIS — J9600 Acute respiratory failure, unspecified whether with hypoxia or hypercapnia: Secondary | ICD-10-CM

## 2020-04-25 LAB — CBC WITH DIFFERENTIAL/PLATELET
Abs Immature Granulocytes: 0.03 10*3/uL (ref 0.00–0.07)
Basophils Absolute: 0 10*3/uL (ref 0.0–0.1)
Basophils Relative: 0 %
Eosinophils Absolute: 0.2 10*3/uL (ref 0.0–0.5)
Eosinophils Relative: 2 %
HCT: 39.3 % (ref 36.0–46.0)
Hemoglobin: 11.8 g/dL — ABNORMAL LOW (ref 12.0–15.0)
Immature Granulocytes: 0 %
Lymphocytes Relative: 25 %
Lymphs Abs: 1.8 10*3/uL (ref 0.7–4.0)
MCH: 22.7 pg — ABNORMAL LOW (ref 26.0–34.0)
MCHC: 30 g/dL (ref 30.0–36.0)
MCV: 75.6 fL — ABNORMAL LOW (ref 80.0–100.0)
Monocytes Absolute: 0.7 10*3/uL (ref 0.1–1.0)
Monocytes Relative: 10 %
Neutro Abs: 4.4 10*3/uL (ref 1.7–7.7)
Neutrophils Relative %: 63 %
Platelets: 265 10*3/uL (ref 150–400)
RBC: 5.2 MIL/uL — ABNORMAL HIGH (ref 3.87–5.11)
RDW: 15.5 % (ref 11.5–15.5)
WBC: 7.1 10*3/uL (ref 4.0–10.5)
nRBC: 0 % (ref 0.0–0.2)

## 2020-04-25 LAB — COMPREHENSIVE METABOLIC PANEL
ALT: 22 U/L (ref 0–44)
AST: 19 U/L (ref 15–41)
Albumin: 2.6 g/dL — ABNORMAL LOW (ref 3.5–5.0)
Alkaline Phosphatase: 44 U/L (ref 38–126)
Anion gap: 8 (ref 5–15)
BUN: 28 mg/dL — ABNORMAL HIGH (ref 8–23)
CO2: 34 mmol/L — ABNORMAL HIGH (ref 22–32)
Calcium: 9.1 mg/dL (ref 8.9–10.3)
Chloride: 102 mmol/L (ref 98–111)
Creatinine, Ser: 1.17 mg/dL — ABNORMAL HIGH (ref 0.44–1.00)
GFR, Estimated: 51 mL/min — ABNORMAL LOW (ref >60)
Glucose, Bld: 72 mg/dL (ref 70–99)
Potassium: 3.5 mmol/L (ref 3.5–5.1)
Sodium: 144 mmol/L (ref 135–145)
Total Bilirubin: 0.6 mg/dL (ref 0.3–1.2)
Total Protein: 5.9 g/dL — ABNORMAL LOW (ref 6.5–8.1)

## 2020-04-25 LAB — BRAIN NATRIURETIC PEPTIDE: B Natriuretic Peptide: 35.9 pg/mL (ref 0.0–100.0)

## 2020-04-25 LAB — GLUCOSE, CAPILLARY
Glucose-Capillary: 151 mg/dL — ABNORMAL HIGH (ref 70–99)
Glucose-Capillary: 228 mg/dL — ABNORMAL HIGH (ref 70–99)
Glucose-Capillary: 223 mg/dL — ABNORMAL HIGH (ref 70–99)

## 2020-04-25 LAB — MAGNESIUM: Magnesium: 2.3 mg/dL (ref 1.7–2.4)

## 2020-04-25 LAB — D-DIMER, QUANTITATIVE: D-Dimer, Quant: 1.47 ug/mL-FEU — ABNORMAL HIGH (ref 0.00–0.50)

## 2020-04-25 LAB — C-REACTIVE PROTEIN: CRP: 0.7 mg/dL (ref <1.0)

## 2020-04-25 MED ORDER — METFORMIN HCL 1000 MG PO TABS: 1000.0000 mg | ORAL_TABLET | Freq: Two times a day (BID) | ORAL | 0 refills | Status: DC

## 2020-04-25 MED ORDER — APIXABAN 5 MG PO TABS: 5.0000 mg | ORAL_TABLET | Freq: Two times a day (BID) | ORAL | 0 refills | Status: DC

## 2020-04-25 MED ORDER — "PEN NEEDLES 3/16"" 31G X 5 MM MISC": 1.0000 | Freq: Four times a day (QID) | 0 refills | Status: DC

## 2020-04-25 MED ORDER — DEXAMETHASONE 6 MG PO TABS: 6.0000 mg | ORAL_TABLET | Freq: Every day | ORAL | 0 refills | Status: DC

## 2020-04-25 MED ORDER — BLOOD GLUCOSE MONITOR KIT: PACK | 0 refills | Status: AC

## 2020-04-25 MED ORDER — INSULIN DETEMIR 100 UNIT/ML FLEXPEN: 10.0000 [IU] | PEN_INJECTOR | Freq: Every day | SUBCUTANEOUS | 0 refills | Status: DC

## 2020-04-25 MED ORDER — INSULIN ASPART 100 UNIT/ML FLEXPEN: 4.0000 [IU] | PEN_INJECTOR | Freq: Three times a day (TID) | SUBCUTANEOUS | 0 refills | Status: DC

## 2020-04-25 MED ORDER — CARVEDILOL 3.125 MG PO TABS: 3.1250 mg | ORAL_TABLET | Freq: Two times a day (BID) | ORAL | 0 refills | Status: DC

## 2020-04-25 MED ORDER — ATORVASTATIN CALCIUM 40 MG PO TABS: 40.0000 mg | ORAL_TABLET | Freq: Every day | ORAL | 0 refills | Status: DC

## 2020-04-25 MED FILL — DEXAMETHASONE 6 MG TABLET: 6 | 4 days supply | Qty: 4 | Fill #0

## 2020-04-25 MED FILL — CARVEDILOL 3.125 MG TABLET: 3.125 | 30 days supply | Qty: 60 | Fill #0

## 2020-04-25 MED FILL — PENTIPS 32G X 4 MM MISC: 32G X 4 MM | 2 days supply | Qty: 100 | Fill #0

## 2020-04-25 MED FILL — METFORMIN HCL 1000 MG TABS: 1000 | 30 days supply | Qty: 60 | Fill #0

## 2020-04-25 MED FILL — TRUE METRIX GLUCOSE TEST ST: 25 days supply | Qty: 100 | Fill #0

## 2020-04-25 MED FILL — ATORVASTATIN CALCIUM 40 MG: 40 | 30 days supply | Qty: 30 | Fill #0

## 2020-04-25 MED FILL — TRUEplus LANCETS 28G MISC: 25 days supply | Qty: 100 | Fill #0

## 2020-04-25 MED FILL — NOVOLOG FLEXPEN SYRINGE: 100 | 34 days supply | Qty: 6 | Fill #0

## 2020-04-25 MED FILL — LEVEMIR FLEXTOUCH 100 UNITS: 100 | 28 days supply | Qty: 3 | Fill #0

## 2020-04-25 MED FILL — ELIQUIS 5 MG TABLET: 5 | 30 days supply | Qty: 60 | Fill #0

## 2020-04-25 MED FILL — TRUE METRIX BLOOD GLUCOSE M: W/DEVICE | 1 days supply | Qty: 1 | Fill #0

## 2020-04-25 NOTE — Progress Notes (Signed)
Inpatient Diabetes Program Recommendations  AACE/ADA: New Consensus Statement on Inpatient Glycemic Control (2015)  Target Ranges:  Prepandial:   less than 140 mg/dL      Peak postprandial:   less than 180 mg/dL (1-2 hours)      Critically ill patients:  140 - 180 mg/dL   Lab Results  Component Value Date   GLUCAP 201 (H) 04/24/2020   HGBA1C 10.1 (H) 04/20/2020    Review of Glycemic Control  Diabetes history: DM2 Outpatient Diabetes medications: Amaryl, metformin 500 mg QAM (has not taken in several months) Current orders for Inpatient glycemic control: Levemir 10 units QD, Novolog 0-15 TID, 0-5 HS + 5 units TID  HgbA1C - 10.1% CBG 72 this am  Nurse educated patient on insulin pen use at home. Reviewed contents of insulin flexpen starter kit. Reviewed all steps if insulin pen including attachment of needle, 2-unit air shot, dialing up dose, giving injection, removing needle, disposal of sharps, storage of unused insulin, disposal of insulin etc. Patient able to provide successful return demonstration. Also reviewed troubleshooting with insulin pen. MD to give patient Rxs for insulin pens and insulin pen needles.  Inpatient Diabetes Program Recommendations:     Levemir Flexpen 10 units QD Novolog Flexpen 4 units TID with meals  Will need insulin pen needles (#[id number]) Pt has meter at home.  Follow.  Thank you. Lorenda Peck, RD, LDN, CDE Inpatient Diabetes Coordinator [phone]

## 2020-04-25 NOTE — TOC Progression Note (Signed)
Transition of Care [facility]) - Progression Note    Patient Details  Name: Chelsea Norton MRN: [id number] Date of Birth: Dec 11, 1950  [facility] CM/SW Contact  Lorri Frederick, LCSW Phone Number: 04/25/2020, 3:31 PM  Clinical Narrative:   Pt discharging home.  PCP arranged at [facility].  Match put in for pt medications.  No O2 needed, shower stool from [employer].  No other needs identified.     Expected Discharge Plan: Home/Self Care Barriers to Discharge: Barriers Resolved  Expected Discharge Plan and Services Expected Discharge Plan: Home/Self Care In-house Referral: Clinical Social Work Discharge Planning Services: CM Consult   Living arrangements for the past 2 months: Apartment Expected Discharge Date: 04/25/20               DME Arranged: Shower stool DME Agency: [employer] Date DME Agency Contacted: 04/25/20 Time DME Agency Contacted: [phone] Representative spoke with at DME Agency: Silvio Pate HH Arranged: NA HH Agency: NA         Social Determinants of Health (SDOH) Interventions    Readmission Risk Interventions No flowsheet data found.

## 2020-04-25 NOTE — Discharge Summary (Signed)
Physician Discharge Summary  Anderia Lorenzo ZOX:[id number] DOB: Oct 10, 1950 DOA: 04/18/2020  PCP: Patient, No Pcp Per  Admit date: 04/18/2020 Discharge date: 04/25/2020  Admitted From: Home Disposition:  Home  Recommendations for Outpatient Follow-up:  1. Follow up with PCP in 1-2 weeks  Discharge Condition:Stable CODE STATUS:Full Diet recommendation: Eliquis   Brief/Interim Summary: 70 year old obese female presents to ED on 1/19 with progressive shortness of breath and hypoxia.  She was diagnosed with acute hypoxic respiratory failure due to COVID-19 pneumonia along with underlying OHS and OSA.  Required heated high flow and BiPAP, was treated with IV steroids remdesivir and Baricitinib by pulmonary critical care.  Stabilized and transferred to hospitalist service on 04/23/2020 on day 4 of her hospital stay.  Discharge Diagnoses:  Active Problems:   Acute respiratory failure due to COVID-19 ([place])   Respiratory failure ([place])   1. Acute Hypoxic Resp. Failure due to Acute Covid 19 Viral Pneumonitis during the ongoing 2020 Covid 19 Pandemic - she has had 2 mRNA shots, there was also element of underlying OHS and OSA to her respiratory failure.  She was initially treated with heated high flow and BiPAP, now stable on nasal cannula.  She has received IV steroids, Remdesivir and Baricitinib.  Clinically much better, on IV Decadron and Baricitinib, O2 was weaned to room air. Plan d/c today.  Encephalopathy secondary to hypercarbic state: Resolved. CT head clear  Acute LLE DVT: on Eliquis.  Minimum 3 months.  Volume overload due to acute diastolic heart failure - echo noted with preserved EF of 55%, has been adequately diuresed will monitor use as needed   Acute Renal Injury: Due to ATN.  Stable.  Demand ischemia: Due to demand mismatch from hypoxia, echo shows preserved EF with no wall motion abnormality.  Blood pressure is stable will add low-dose beta-blocker and Lipitor, continued  on anticoagulation for blood clot.  Morbid obesity BMI 44 with OSA.  Follow with PCP for weight loss, she used to have a BiPAP several years ago but has since lost the device, recommend outpatient sleep study and pulmonary follow-up.  Diabetes: Poor outpatient control due to hyperglycemia A1c of 10, on long-acting insulin and sliding scale, ordered DM and insulin education on 04/23/20.  Plan to d/c on scheduled insulin  Discharge Instructions  Allergies as of 04/25/2020   No Known Allergies     Medication List    STOP taking these medications   AMARYL PO   metFORMIN 500 MG tablet Commonly known as: GLUCOPHAGE     TAKE these medications   albuterol 108 (90 Base) MCG/ACT inhaler Commonly known as: VENTOLIN HFA Inhale 1-2 puffs into the lungs every 6 (six) hours as needed for wheezing or shortness of breath.   apixaban 5 MG Tabs tablet Commonly known as: ELIQUIS Take 1 tablet (5 mg total) by mouth 2 (two) times daily. Start taking on: April 26, 2020   atorvastatin 40 MG tablet Commonly known as: LIPITOR Take 1 tablet (40 mg total) by mouth daily. Start taking on: April 26, 2020   b complex vitamins capsule Take 1 capsule by mouth daily.   blood glucose meter kit and supplies Kit Dispense based on patient and insurance preference. Use up to four times daily as directed. (FOR ICD-9 250.00, 250.01).   carvedilol 3.125 MG tablet Commonly known as: COREG Take 1 tablet (3.125 mg total) by mouth 2 (two) times daily with a meal.   CoQ10 30 MG Caps Take 30 mg by mouth daily.  dexamethasone 6 MG tablet Commonly known as: DECADRON Take 1 tablet (6 mg total) by mouth daily for 4 days.   ferrous sulfate 325 (65 FE) MG tablet Take 325 mg by mouth daily with breakfast.   Fish Oil 1000 MG Caps Take 1,000 mg by mouth daily.   insulin aspart 100 UNIT/ML FlexPen Commonly known as: NOVOLOG Inject 4 Units into the skin 3 (three) times daily with meals.   insulin detemir  100 UNIT/ML FlexPen Commonly known as: LEVEMIR Inject 10 Units into the skin daily.   OVER THE COUNTER MEDICATION Take 400 mg by mouth daily. echinacea   Pen Needles 3/16" 31G X 5 MM Misc 1 Device by Does not apply route QID.   QC Tumeric Complex 500 MG Caps Generic drug: Turmeric Take 500 mg by mouth daily.   vitamin C 1000 MG tablet Take 1,000 mg by mouth daily.   Vitamin D (Cholecalciferol) 25 MCG (1000 UT) Tabs Take 1,000 Units by mouth daily.   vitamin k 100 MCG tablet Take 100 mcg by mouth daily.   Zinc 50 MG Caps Take 50 mg by mouth daily.            Durable Medical Equipment  (From admission, onward)         Start     Ordered   04/25/20 1549  For home use only DME Shower stool  Once        04/25/20 1549          Follow-up Information    POST-COVID [facility]. Go on 05/08/2020.   Why: Tuesday February 8th at 2pm for follow up appointment - call sooner if you are having problems  Contact information: [place] [phone] [facility] Follow up on 05/01/2020.   Why: Your hospital follow up appointment will be a phone call appointment on Tuesday, 05/01/20, at 3:10pm.  They will call your cell phone.  Please make sure you are there to receive this call.  Contact information: [place] [phone]             No Known Allergies   Procedures/Studies: DG Chest 2 View  Result Date: 04/18/2020 CLINICAL DATA:  Shortness of breath. EXAM: CHEST - 2 VIEW COMPARISON:  None. FINDINGS: Moderate to marked severity patchy infiltrates are seen within the bilateral lung bases and mid lung fields, bilaterally. There is no evidence of a pleural effusion or pneumothorax. The cardiac silhouette is markedly enlarged. Widening of the mediastinum is also seen which may be positional in nature. Degenerative changes are seen throughout  the thoracic spine. IMPRESSION: 1. Cardiomegaly with moderate to marked severity bilateral infiltrates. Electronically Signed   By: Virgina Norfolk M.D.   On: 04/18/2020 23:32   CT HEAD WO CONTRAST  Result Date: 04/19/2020 CLINICAL DATA:  Mental status change, persistent or worsening. Shortness of breath on BiPAP EXAM: CT HEAD WITHOUT CONTRAST TECHNIQUE: Contiguous axial images were obtained from the base of the skull through the vertex without intravenous contrast. COMPARISON:  None. FINDINGS: Brain: No evidence of acute infarction, hemorrhage, hydrocephalus, extra-axial collection or mass lesion/mass effect. Patchy low-density areas in the bilateral cerebral white matter the out detected swelling, likely chronic small vessel disease related to patient's history of hypertension. Some of these areas are subcortical and very low-density, likely dilated perivascular spaces. Vascular: No hyperdense vessel or unexpected calcification. Skull: Normal. Negative  for fracture or focal lesion. Sinuses/Orbits: No acute finding. IMPRESSION: 1. No acute finding. 2. Chronic white matter disease. Electronically Signed   By: Monte Fantasia M.D.   On: 04/19/2020 07:11   ECHOCARDIOGRAM COMPLETE  Result Date: 04/19/2020    ECHOCARDIOGRAM REPORT   Patient Name:   JENITA RAYFIELD Date of Exam: 04/19/2020 Medical Rec #:  [id number]        Height:       64.0 in Accession #:    [id number]       Weight:       247.6 lb Date of Birth:  [birth year]       BSA:          2.142 m Patient Age:    68 years         BP:           120/71 mmHg Patient Gender: F                HR:           92 bpm. Exam Location:  Inpatient Procedure: 2D Echo, Cardiac Doppler and Color Doppler Indications:    I50.23 Acute on chronic systolic (congestive) heart failure  History:        Patient has no prior history of Echocardiogram examinations.                 Risk Factors:Hypertension. COVID-19 Positive.  Sonographer:    Tiffany Dance Referring Phys: [place]  1. Left ventricular ejection fraction, by estimation, is 55 to 60%. The left ventricle has normal function. The left ventricle has no regional wall motion abnormalities. There is moderate concentric left ventricular hypertrophy. Left ventricular diastolic parameters are consistent with Grade II diastolic dysfunction (pseudonormalization). Elevated left ventricular end-diastolic pressure.  2. Right ventricular systolic function is normal. The right ventricular size is normal. There is mildly elevated pulmonary artery systolic pressure.  3. Left atrial size was mildly dilated.  4. Right atrial size was mildly dilated.  5. The mitral valve is normal in structure. Trivial mitral valve regurgitation. No evidence of mitral stenosis.  6. The aortic valve is tricuspid. There is mild calcification of the aortic valve. Aortic valve regurgitation is not visualized. Mild aortic valve sclerosis is present, with no evidence of aortic valve stenosis.  7. Aortic dilatation noted. There is mild to moderate dilatation of the ascending aorta, measuring 42 mm.  8. The inferior vena cava is dilated in size with >50% respiratory variability, suggesting right atrial pressure of 8 mmHg. Comparison(s): No prior Echocardiogram. FINDINGS  Left Ventricle: Left ventricular ejection fraction, by estimation, is 55 to 60%. The left ventricle has normal function. The left ventricle has no regional wall motion abnormalities. The left ventricular internal cavity size was normal in size. There is  moderate concentric left ventricular hypertrophy. Left ventricular diastolic parameters are consistent with Grade II diastolic dysfunction (pseudonormalization). Elevated left ventricular end-diastolic pressure. Right Ventricle: The right ventricular size is normal. Right vetricular wall thickness was not well visualized. Right ventricular systolic function is normal. There is mildly elevated pulmonary artery systolic  pressure. The tricuspid regurgitant velocity  is 2.73 m/s, and with an assumed right atrial pressure of 8 mmHg, the estimated right ventricular systolic pressure is 89.2 mmHg. Left Atrium: Left atrial size was mildly dilated. Right Atrium: Right atrial size was mildly dilated. Pericardium: Trivial pericardial effusion is present. Mitral Valve: The mitral valve is normal in structure. Mild mitral annular calcification. Trivial  mitral valve regurgitation. No evidence of mitral valve stenosis. Tricuspid Valve: The tricuspid valve is normal in structure. Tricuspid valve regurgitation is mild . No evidence of tricuspid stenosis. Aortic Valve: The aortic valve is tricuspid. There is mild calcification of the aortic valve. Aortic valve regurgitation is not visualized. Mild aortic valve sclerosis is present, with no evidence of aortic valve stenosis. Pulmonic Valve: The pulmonic valve was not well visualized. Pulmonic valve regurgitation is mild. No evidence of pulmonic stenosis. Aorta: Aortic dilatation noted. There is mild to moderate dilatation of the ascending aorta, measuring 42 mm. Venous: The inferior vena cava is dilated in size with greater than 50% respiratory variability, suggesting right atrial pressure of 8 mmHg. IAS/Shunts: The atrial septum is grossly normal.  LEFT VENTRICLE PLAX 2D LVIDd:         3.50 cm  Diastology LVIDs:         2.40 cm  LV e' medial:    4.35 cm/s LV PW:         1.50 cm  LV E/e' medial:  16.6 LV IVS:        1.50 cm  LV e' lateral:   4.68 cm/s LVOT diam:     2.00 cm  LV E/e' lateral: 15.5 LV SV:         68 LV SV Index:   32 LVOT Area:     3.14 cm  RIGHT VENTRICLE             IVC RV Basal diam:  2.70 cm     IVC diam: 2.90 cm RV S prime:     11.40 cm/s TAPSE (M-mode): 2.0 cm LEFT ATRIUM             Index       RIGHT ATRIUM           Index LA diam:        3.60 cm 1.68 cm/m  RA Area:     20.40 cm LA Vol (A2C):   49.2 ml 22.97 ml/m RA Volume:   66.40 ml  31.00 ml/m LA Vol (A4C):   67.3 ml  31.42 ml/m LA Biplane Vol: 57.8 ml 26.98 ml/m  AORTIC VALVE LVOT Vmax:   117.00 cm/s LVOT Vmean:  74.100 cm/s LVOT VTI:    0.218 m  AORTA Ao Root diam: 3.50 cm Ao Asc diam:  4.20 cm MITRAL VALVE               TRICUSPID VALVE MV Area (PHT): 3.03 cm    TR Peak grad:   29.8 mmHg MV Decel Time: 250 msec    TR Vmax:        273.00 cm/s MV E velocity: 72.40 cm/s MV A velocity: 67.70 cm/s  SHUNTS MV E/A ratio:  1.07        Systemic VTI:  0.22 m                            Systemic Diam: 2.00 cm Buford Dresser MD Electronically signed by Buford Dresser MD Signature Date/Time: 04/19/2020/2:56:42 PM    Final    VAS [name] LOWER EXTREMITY VENOUS (DVT)  Result Date: 04/19/2020  Lower Venous DVT Study Other Indications: Covid, D-dimer. Risk Factors: None identified. Comparison Study: No previous exams Performing Technologist: Vonzell Schlatter RVT  Examination Guidelines: A complete evaluation includes B-mode imaging, spectral Doppler, color Doppler, and power Doppler as needed of all accessible portions of each vessel. Bilateral  testing is considered an integral part of a complete examination. Limited examinations for reoccurring indications may be performed as noted. The reflux portion of the exam is performed with the patient in reverse Trendelenburg.  +---------+---------------+---------+-----------+----------+--------------+ RIGHT    CompressibilityPhasicitySpontaneityPropertiesThrombus Aging +---------+---------------+---------+-----------+----------+--------------+ CFV      Full           Yes      Yes                                 +---------+---------------+---------+-----------+----------+--------------+ SFJ      Full                                                        +---------+---------------+---------+-----------+----------+--------------+ FV Prox  Full                                                         +---------+---------------+---------+-----------+----------+--------------+ FV Mid   Full                                                        +---------+---------------+---------+-----------+----------+--------------+ FV DistalFull                                                        +---------+---------------+---------+-----------+----------+--------------+ PFV      Full                                                        +---------+---------------+---------+-----------+----------+--------------+ POP      Full           Yes      Yes                                 +---------+---------------+---------+-----------+----------+--------------+ PTV      Full                                                        +---------+---------------+---------+-----------+----------+--------------+ PERO     Full                                                        +---------+---------------+---------+-----------+----------+--------------+   +---------+---------------+---------+-----------+----------+--------------+ LEFT     CompressibilityPhasicitySpontaneityPropertiesThrombus Aging +---------+---------------+---------+-----------+----------+--------------+ CFV  Full           Yes      Yes                                 +---------+---------------+---------+-----------+----------+--------------+ SFJ      Full                                                        +---------+---------------+---------+-----------+----------+--------------+ FV Prox  Full                                                        +---------+---------------+---------+-----------+----------+--------------+ FV Mid   Full                                                        +---------+---------------+---------+-----------+----------+--------------+ FV DistalNone                                         Acute           +---------+---------------+---------+-----------+----------+--------------+ PFV      Full                                                        +---------+---------------+---------+-----------+----------+--------------+ POP      None                                         Acute          +---------+---------------+---------+-----------+----------+--------------+ PTV      None                                         Acute          +---------+---------------+---------+-----------+----------+--------------+ PERO     None                                         Acute          +---------+---------------+---------+-----------+----------+--------------+     Summary: RIGHT: - No evidence of deep vein thrombosis in the lower extremity. No indirect evidence of obstruction proximal to the inguinal ligament. - No cystic structure found in the popliteal fossa.  LEFT: - Findings consistent with acute deep vein thrombosis involving the left femoral vein, left popliteal vein, left posterior tibial veins, and left peroneal veins. - No cystic structure found in the popliteal fossa.  *See table(s) above  for measurements and observations. Electronically signed by Servando Snare MD on 04/19/2020 at 2:28:41 PM.    Final      Subjective: Eager to go home  Discharge Exam: Vitals:   04/25/20 0412 04/25/20 0840  BP: (!) 145/75 (!) 169/88  Pulse: 87 93  Resp: 19 20  Temp: 98.3 F (36.8 C) 98.4 F (36.9 C)  SpO2: 98% 100%   Vitals:   04/24/20 2121 04/25/20 0013 04/25/20 0412 04/25/20 0840  BP: (!) 169/79 (!) 154/96 (!) 145/75 (!) 169/88  Pulse: 73 69 87 93  Resp: _0 Temp: 98 F (36.7 C) 98.1 F (36.7 C) 98.3 F (36.8 C) 98.4 F (36.9 C)  TempSrc: Oral Oral Oral Oral  SpO2: 99% 97% 98% 100%  Weight:      Height:        General: Pt is alert, awake, not in acute distress Cardiovascular: RRR, S1/S2 +, no rubs, no gallops Respiratory: CTA bilaterally, no wheezing, no  rhonchi Abdominal: Soft, NT, ND, bowel sounds + Extremities: no edema, no cyanosis   The results of significant diagnostics from this hospitalization (including imaging, microbiology, ancillary and laboratory) are listed below for reference.     Microbiology: Recent Results (from the past 240 hour(s))  Culture, blood (Routine x 2)     Status: None   Collection Time: 04/18/20 10:15 PM   Specimen: BLOOD LEFT HAND  Result Value Ref Range Status   Specimen Description BLOOD LEFT HAND  Final   Special Requests   Final    BOTTLES DRAWN AEROBIC AND ANAEROBIC Blood Culture adequate volume   Culture   Final    NO GROWTH 5 DAYS Performed at [facility], [address] [place], [place] [address]    Report Status 04/24/2020 FINAL  Final  Resp Panel by RT-PCR (Flu A&B, Covid) Nasopharyngeal Swab     Status: Abnormal   Collection Time: 04/18/20 11:13 PM   Specimen: Nasopharyngeal Swab; Nasopharyngeal(NP) swabs in vial transport medium  Result Value Ref Range Status   SARS Coronavirus 2 by RT PCR POSITIVE (A) NEGATIVE Final    Comment: RESULT CALLED TO, READ BACK BY AND VERIFIED WITH: E SULLIVAN RN 04/19/20 0046 JDW (NOTE) SARS-CoV-2 target nucleic acids are DETECTED.  The SARS-CoV-2 RNA is generally detectable in upper respiratory specimens during the acute phase of infection. Positive results are indicative of the presence of the identified virus, but do not rule out bacterial infection or co-infection with other pathogens not detected by the test. Clinical correlation with patient history and other diagnostic information is necessary to determine patient infection status. The expected result is Negative.  Fact Sheet for Patients: EntrepreneurPulse.com.au  Fact Sheet for Healthcare Providers: IncredibleEmployment.be  This test is not yet approved or cleared by the [place] FDA and  has been authorized for detection and/or diagnosis of  SARS-CoV-2 by FDA under an Emergency Use Authorization (EUA).  This EUA will remain in effect (meaning this test can be [name] ed) for the duration of  the COVID-19 declaration under Section 564(b)(1) of the Act, 21 U.S.C. section 360bbb-3(b)(1), unless the authorization is terminated or revoked sooner.     Influenza A by PCR NEGATIVE NEGATIVE Final   Influenza B by PCR NEGATIVE NEGATIVE Final    Comment: (NOTE) The Xpert Xpress SARS-CoV-2/FLU/RSV plus assay is intended as an aid in the diagnosis of influenza from Nasopharyngeal swab specimens and should not be used as a sole basis for treatment. Nasal washings  and aspirates are unacceptable for Xpert Xpress SARS-CoV-2/FLU/RSV testing.  Fact Sheet for Patients: EntrepreneurPulse.com.au  Fact Sheet for Healthcare Providers: IncredibleEmployment.be  This test is not yet approved or cleared by the [place] FDA and has been authorized for detection and/or diagnosis of SARS-CoV-2 by FDA under an Emergency Use Authorization (EUA). This EUA will remain in effect (meaning this test can be used) for the duration of the COVID-19 declaration under Section 564(b)(1) of the Act, 21 U.S.C. section 360bbb-3(b)(1), unless the authorization is terminated or revoked.  Performed at [facility], [place] [address], [place], [place] [address]   Culture, blood (Routine x 2)     Status: None   Collection Time: 04/19/20  2:35 AM   Specimen: BLOOD LEFT HAND  Result Value Ref Range Status   Specimen Description BLOOD LEFT HAND  Final   Special Requests   Final    BOTTLES DRAWN AEROBIC AND ANAEROBIC Blood Culture adequate volume   Culture   Final    NO GROWTH 5 DAYS Performed at [facility], [place] [address], [place], [place] [address]    Report Status 04/24/2020 FINAL  Final  MRSA PCR Screening     Status: None   Collection Time: 04/19/20  8:25 AM   Specimen: Nasal Mucosa; Nasopharyngeal  Result  Value Ref Range Status   MRSA by PCR NEGATIVE NEGATIVE Final    Comment:        The GeneXpert MRSA Assay (FDA approved for NASAL specimens only), is one component of a comprehensive MRSA colonization surveillance program. It is not intended to diagnose MRSA infection nor to guide or monitor treatment for MRSA infections. Performed at [facility], [place] [address], [place], [place] [address]      Labs: BNP (last 3 results) Recent Labs    04/19/20 1102 04/24/20 0722 04/25/20 0320  BNP 306.9* 24.9 47.0   Basic Metabolic Panel: Recent Labs  Lab 04/20/20 0319 04/21/20 1708 04/22/20 0217 04/23/20 0418 04/24/20 0722 04/25/20 0320  NA 143 137 140 139 141 144  K 3.7 4.4 4.3 3.5 3.8 3.5  CL 100 98 100 98 96* 102  CO2 31 30 32 32 35* 34*  GLUCOSE 144* 347* 211* 108* 137* 72  BUN 32* 35* 36* 32* 28* 28*  CREATININE 1.54* 1.26* 1.36* 1.20* 1.31* 1.17*  CALCIUM 8.8* 8.5* 8.7* 8.7* 8.9 9.1  MG 2.0  --   --   --  2.3 2.3  PHOS 5.1*  --   --   --   --   --    Liver Function Tests: Recent Labs  Lab 04/18/20 2316 04/24/20 0722 04/25/20 0320  AST 46* 20 19  ALT 41 22 22  ALKPHOS 74 47 44  BILITOT 0.7 0.8 0.6  PROT 7.5 6.0* 5.9*  ALBUMIN 3.2* 2.6* 2.6*   No results for input(s): LIPASE, AMYLASE in the last 168 hours. No results for input(s): AMMONIA in the last 168 hours. CBC: Recent Labs  Lab 04/18/20 2316 04/19/20 0741 04/21/20 1708 04/22/20 0217 04/23/20 0418 04/24/20 0722 04/25/20 0320  WBC 8.1   < > 5.2 5.6 5.7 8.6 7.1  NEUTROABS 5.3  --   --   --   --  7.2 4.4  HGB 14.2   < > 12.6 12.5 12.3 12.1 11.8*  HCT 48.0*   < > 45.2 43.4 40.8 40.9 39.3  MCV 77.9*   < > 78.1* 77.8* 76.0* 76.4* 75.6*  PLT 238   < >  224 221 230 263 265   < > = values in this interval not displayed.   Cardiac Enzymes: No results for input(s): CKTOTAL, CKMB, CKMBINDEX, TROPONINI in the last 168 hours. BNP: Invalid input(s): POCBNP CBG: Recent Labs  Lab 04/24/20 1616  04/24/20 2124 04/25/20 0837 04/25/20 1241 04/25/20 1645  GLUCAP 324* 201* 151* 228* 223*   D-Dimer Recent Labs    04/24/20 0722 04/25/20 0320  DDIMER 1.94* 1.47*   Hgb A1c No results for input(s): HGBA1C in the last 72 hours. Lipid Profile No results for input(s): CHOL, HDL, LDLCALC, TRIG, CHOLHDL, LDLDIRECT in the last 72 hours. Thyroid function studies No results for input(s): TSH, T4TOTAL, T3FREE, THYROIDAB in the last 72 hours.  Invalid input(s): FREET3 Anemia work up No results for input(s): VITAMINB12, FOLATE, FERRITIN, TIBC, IRON, RETICCTPCT in the last 72 hours. Urinalysis No results found for: COLORURINE, APPEARANCEUR, [place], [place], [place], [place], [place], [place], PROTEINUR, UROBILINOGEN, NITRITE, LEUKOCYTESUR Sepsis Labs Invalid input(s): PROCALCITONIN,  WBC,  LACTICIDVEN Microbiology Recent Results (from the past 240 hour(s))  Culture, blood (Routine x 2)     Status: None   Collection Time: 04/18/20 10:15 PM   Specimen: BLOOD LEFT HAND  Result Value Ref Range Status   Specimen Description BLOOD LEFT HAND  Final   Special Requests   Final    BOTTLES DRAWN AEROBIC AND ANAEROBIC Blood Culture adequate volume   Culture   Final    NO GROWTH 5 DAYS Performed at [facility], [address] [place], [place] [address]    Report Status 04/24/2020 FINAL  Final  Resp Panel by RT-PCR (Flu A&B, Covid) Nasopharyngeal Swab     Status: Abnormal   Collection Time: 04/18/20 11:13 PM   Specimen: Nasopharyngeal Swab; Nasopharyngeal(NP) swabs in vial transport medium  Result Value Ref Range Status   SARS Coronavirus 2 by RT PCR POSITIVE (A) NEGATIVE Final    Comment: RESULT CALLED TO, READ BACK BY AND VERIFIED WITH: E SULLIVAN RN 04/19/20 0046 JDW (NOTE) SARS-CoV-2 target nucleic acids are DETECTED.  The SARS-CoV-2 RNA is generally detectable in upper respiratory specimens during the acute phase of infection. Positive results are indicative of the  presence of the identified virus, but do not rule out bacterial infection or co-infection with other pathogens not detected by the test. Clinical correlation with patient history and other diagnostic information is necessary to determine patient infection status. The expected result is Negative.  Fact Sheet for Patients: EntrepreneurPulse.com.au  Fact Sheet for Healthcare Providers: IncredibleEmployment.be  This test is not yet approved or cleared by the [place] FDA and  has been authorized for detection and/or diagnosis of SARS-CoV-2 by FDA under an Emergency Use Authorization (EUA).  This EUA will remain in effect (meaning this test can be [name] ed) for the duration of  the COVID-19 declaration under Section 564(b)(1) of the Act, 21 U.S.C. section 360bbb-3(b)(1), unless the authorization is terminated or revoked sooner.     Influenza A by PCR NEGATIVE NEGATIVE Final   Influenza B by PCR NEGATIVE NEGATIVE Final    Comment: (NOTE) The Xpert Xpress SARS-CoV-2/FLU/RSV plus assay is intended as an aid in the diagnosis of influenza from Nasopharyngeal swab specimens and should not be used as a sole basis for treatment. Nasal washings and aspirates are unacceptable for Xpert Xpress SARS-CoV-2/FLU/RSV testing.  Fact Sheet for Patients: EntrepreneurPulse.com.au  Fact Sheet for Healthcare Providers: IncredibleEmployment.be  This test is not yet approved or cleared by the [place] FDA and has  been authorized for detection and/or diagnosis of SARS-CoV-2 by FDA under an Emergency Use Authorization (EUA). This EUA will remain in effect (meaning this test can be used) for the duration of the COVID-19 declaration under Section 564(b)(1) of the Act, 21 U.S.C. section 360bbb-3(b)(1), unless the authorization is terminated or revoked.  Performed at [facility], [place] [address] [place], [place] [address]    Culture, blood (Routine x 2)     Status: None   Collection Time: 04/19/20  2:35 AM   Specimen: BLOOD LEFT HAND  Result Value Ref Range Status   Specimen Description BLOOD LEFT HAND  Final   Special Requests   Final    BOTTLES DRAWN AEROBIC AND ANAEROBIC Blood Culture adequate volume   Culture   Final    NO GROWTH 5 DAYS Performed at [facility], [place] [address] [place], [place] [address]    Report Status 04/24/2020 FINAL  Final  MRSA PCR Screening     Status: None   Collection Time: 04/19/20  8:25 AM   Specimen: Nasal Mucosa; Nasopharyngeal  Result Value Ref Range Status   MRSA by PCR NEGATIVE NEGATIVE Final    Comment:        The GeneXpert MRSA Assay (FDA approved for NASAL specimens only), is one component of a comprehensive MRSA colonization surveillance program. It is not intended to diagnose MRSA infection nor to guide or monitor treatment for MRSA infections. Performed at [facility], [place] [address] [place], [place] [address]    Time spent: 30 min  SIGNED:   Marylu Lund, MD  [facility] 04/25/2020, 7:58 PM  If 7PM-7AM, please contact night-coverage

## 2020-04-25 NOTE — Plan of Care (Signed)

## 2020-04-25 NOTE — TOC Initial Note (Addendum)
Transition of Care [facility]) - Initial/Assessment Note    Patient Details  Name: Chelsea Norton MRN: [id number] Date of Birth: 1950-06-09  [facility]) CM/SW Contact:    Lorri Frederick, LCSW Phone Number: 04/25/2020, 9:52 AM  Clinical Narrative:  CSW completed phone assessment due to pt being covid +.  Pt aware that there is no recommendation for follow up OT/PT and in agreement with this.  Pt had questions about home O2 and has not been on O2 at home.  Pt is uninsured and does not have current PCP, willing to be referred to [facility], discussed elequis and how she would need to work with PCP for this medication due to lack of insurance.  Permission given to speak with daughter Orlene Och.  Pt is vaccinated for covid, but not boosted.  Discussed equipment: she does not need bedside commode but would like shower chair.                 Expected Discharge Plan: Home/Self Care Barriers to Discharge: Other (comment) (uninsured on elequis)   Patient Goals and CMS Choice Patient states their goals for this hospitalization and ongoing recovery are:: "get back to as much normal as I can--taking short walks" CMS Medicare.gov Compare Post Acute Care list provided to::  (na)    Expected Discharge Plan and Services Expected Discharge Plan: Home/Self Care In-house Referral: Clinical Social Work Discharge Planning Services: CM Consult   Living arrangements for the past 2 months: Apartment                           HH Arranged: NA HH Agency: NA        Prior Living Arrangements/Services Living arrangements for the past 2 months: Apartment Lives with:: Adult Children Patient language and need for interpreter reviewed:: Yes Do you feel safe going back to the place where you live?: Yes      Need for Family Participation in Patient Care: No (Comment) Care giver support system in place?: Yes (comment) Current home services: Other (comment) (none) Criminal  Activity/Legal Involvement Pertinent to Current Situation/Hospitalization: No - Comment as needed  Activities of Daily Living      Permission Sought/Granted Permission sought to share information with : Family Supports Permission granted to share information with : Yes, Verbal Permission Granted  Share Information with NAME: daughter Caseele           Emotional Assessment Appearance:: Other (Comment Required (phone assessment: covid +) Attitude/Demeanor/Rapport: Engaged Affect (typically observed): Pleasant,Appropriate Orientation: : Oriented to Self,Oriented to Place,Oriented to  Time,Oriented to Situation Alcohol / Substance Use: Not Applicable Psych Involvement: No (comment)  Admission diagnosis:  Respiratory failure (HCC) [J96.90] Hypoxia [R09.02] Acute respiratory failure with hypoxia (HCC) [J96.01] Acute respiratory failure due to COVID-19 (HCC) [U07.1, J96.00] Patient Active Problem List   Diagnosis Date Noted  . Acute respiratory failure due to COVID-19 (HCC) 04/19/2020  . Respiratory failure (HCC) 04/19/2020   PCP:  Patient, No Pcp Per Pharmacy:   [id number] - [place] - [address] [place] ([place]) [place] [address] Phone: [phone] Fax: [phone]     Social Determinants of Health (SDOH) Interventions    Readmission Risk Interventions No flowsheet data found.

## 2020-04-26 ENCOUNTER — Telehealth (HOSPITAL_COMMUNITY): Payer: Self-pay

## 2020-04-26 NOTE — Telephone Encounter (Signed)
Pharmacy Transitions of Care Follow-up Telephone Call  Date of discharge: 04/25/20 Discharge Diagnosis: COVID/DVT  How have you been since you were released from the hospital? She is tired but trying to stay positive about her situation,. Pt aware of s/sx of bleeding to watch for.  Medication changes made at discharge: yes  Medication changes obtained and verified? yes    Medication Accessibility:  Home Pharmacy: none   Was the patient provided with refills on discharged medications? No, she was instructed to ask about refills at upcoming F/U  Is the patient able to afford medications? Patient does not have insurance and was discharge on MATCH, will send message to provider to see if she is eligible for IM program or CHF clinic.   Medication Review:  APIXABEN (ELIQUIS)  Apixaban 5 mg BID initiated on 04/25/20.  - Discussed importance of taking medication around the same time everyday   - Advised patient of medications to avoid (NSAIDs, ASA)  - Educated that Tylenol (acetaminophen) will be the preferred analgesic to prevent risk of bleeding  - Emphasized importance of monitoring for signs and symptoms of bleeding (abnormal bruising, prolonged bleeding, nose bleeds, bleeding from gums, discolored urine, black tarry stools)    Follow-up Appointments:  Patient has virtual follow up with Dr. Laural Benes in Internal Medicine on 05/01/20. She will need refills at this visit.  If their condition worsens, is the pt aware to call PCP or go to the Emergency Dept.? Yes  Final Patient Assessment: Patient is doing okay. She knows s/sx of bleeding to look for. She has follow up scheduled and should receive refills at this visit. Will message provider about possible patient assistance.

## 2020-05-01 ENCOUNTER — Other Ambulatory Visit: Payer: Self-pay

## 2020-05-01 ENCOUNTER — Ambulatory Visit: Payer: Self-pay | Attending: Internal Medicine | Admitting: Internal Medicine

## 2020-05-01 DIAGNOSIS — E669 Obesity, unspecified: Secondary | ICD-10-CM | POA: Insufficient documentation

## 2020-05-01 DIAGNOSIS — N289 Disorder of kidney and ureter, unspecified: Secondary | ICD-10-CM

## 2020-05-01 DIAGNOSIS — I82402 Acute embolism and thrombosis of unspecified deep veins of left lower extremity: Secondary | ICD-10-CM

## 2020-05-01 DIAGNOSIS — I5189 Other ill-defined heart diseases: Secondary | ICD-10-CM | POA: Insufficient documentation

## 2020-05-01 DIAGNOSIS — E119 Type 2 diabetes mellitus without complications: Secondary | ICD-10-CM | POA: Insufficient documentation

## 2020-05-01 DIAGNOSIS — U071 COVID-19: Secondary | ICD-10-CM

## 2020-05-01 DIAGNOSIS — Z09 Encounter for follow-up examination after completed treatment for conditions other than malignant neoplasm: Secondary | ICD-10-CM

## 2020-05-01 DIAGNOSIS — E1169 Type 2 diabetes mellitus with other specified complication: Secondary | ICD-10-CM

## 2020-05-01 DIAGNOSIS — G4733 Obstructive sleep apnea (adult) (pediatric): Secondary | ICD-10-CM | POA: Insufficient documentation

## 2020-05-01 DIAGNOSIS — L0291 Cutaneous abscess, unspecified: Secondary | ICD-10-CM

## 2020-05-01 DIAGNOSIS — E1159 Type 2 diabetes mellitus with other circulatory complications: Secondary | ICD-10-CM | POA: Insufficient documentation

## 2020-05-01 DIAGNOSIS — J1282 Pneumonia due to coronavirus disease 2019: Secondary | ICD-10-CM

## 2020-05-01 MED ORDER — PEN NEEDLES 3/16" 31G X 5 MM MISC
1.0000 | Freq: Four times a day (QID) | 6 refills | Status: DC
Start: 2020-05-01 — End: 2020-05-29

## 2020-05-01 MED ORDER — INSULIN ASPART 100 UNIT/ML FLEXPEN
4.0000 [IU] | PEN_INJECTOR | Freq: Three times a day (TID) | SUBCUTANEOUS | 6 refills | Status: DC
Start: 2020-05-01 — End: 2020-05-29

## 2020-05-01 MED ORDER — CARVEDILOL 3.125 MG PO TABS: 3.1250 mg | ORAL_TABLET | Freq: Two times a day (BID) | ORAL | 6 refills | Status: DC

## 2020-05-01 MED ORDER — ATORVASTATIN CALCIUM 40 MG PO TABS: 40.0000 mg | ORAL_TABLET | Freq: Every day | ORAL | 6 refills | Status: DC

## 2020-05-01 MED ORDER — SULFAMETHOXAZOLE-TRIMETHOPRIM 800-160 MG PO TABS: 1.0000 | ORAL_TABLET | Freq: Two times a day (BID) | ORAL | 0 refills | Status: DC

## 2020-05-01 MED ORDER — TRUEPLUS LANCETS 28G MISC: 4 refills | Status: DC

## 2020-05-01 MED ORDER — INSULIN DETEMIR 100 UNIT/ML FLEXPEN
10.0000 [IU] | PEN_INJECTOR | Freq: Every day | SUBCUTANEOUS | 6 refills | Status: DC
Start: 2020-05-01 — End: 2020-05-29

## 2020-05-01 MED ORDER — TRUE METRIX BLOOD GLUCOSE TEST VI STRP: ORAL_STRIP | 12 refills | Status: DC

## 2020-05-01 MED ORDER — APIXABAN 5 MG PO TABS: 5.0000 mg | ORAL_TABLET | Freq: Two times a day (BID) | ORAL | 1 refills | Status: DC

## 2020-05-01 NOTE — Progress Notes (Signed)
Pt states her blood sugar was 115 before breakfast

## 2020-05-01 NOTE — Progress Notes (Signed)
Virtual Visit via Telephone Note  I connected with Chelsea Norton on 05/01/20 at 3:44 p.m by telephone and verified that I am speaking with the correct person using two identifiers.  Location: Patient: home Provider: office The patient, her daughter Mahalia Longest, my CMA Ms. Julius Bowels and myself participated in this encounter.   I discussed the limitations, risks, security and privacy concerns of performing an evaluation and management service by telephone and the availability of in person appointments. I also discussed with the patient that there may be a patient responsible charge related to this service. The patient expressed understanding and agreed to proceed.   History of Present Illness: Patient with history of Covid, DM type II, HL, OHS, OSA, obesity.  This is new patient visit and hospital follow-up.  Patient relocated from [facility] little over a year ago.  She has not establish with a PCP since being here.  Patient hospitalized 1/19-26/2022 with shortness of breath and hypoxia.  Found to have COVID-19 pneumonia with underlying OHS and OSA.  She required BiPAP, IV steroids, remdesivir and baricitinib.  Patient improved clinically and was able to be weaned off of oxygen.  She was found to have acute DVT in the left lower extremity.  She was started on Eliquis with plans to continue for 3 months.  She was found to be volume overloaded due to acute diastolic heart failure.  Echo revealed preserved EF of 55% with LVH and grade 2 diastolic dysfunction.  She was found to have demand ischemia and was started on beta-blocker and Lipitor.  In regards to her diabetes, her A1c was 10.  Patient placed on Levemir and NovoLog insulin.  She received diabetic and insulin education.  Today: Patient reports that she is doing much better.  She does not have any cough and does not feel short of breath as much anymore.  DVT: Reports compliance with Eliquis.  She has some bruises on the arm  which she states is from IV lines from hospitalization.  Otherwise she denies any other bruising or blood in the urine, stools or epistaxis.  DM type II: She reports taking the Levemir 10 units and NovoLog 4 units with meals.  She checks her blood sugars about 6 times a day before and after meals.  Blood sugar before meals is ranging 111-180.  The highest that she has had was 225.  Daughter has been checking her blood sugar 1 hours after meals.  That range has been 120-231.  She is doing better with eating habits.  She is requesting refills on test strips and lancets.  OSA/OHS: Patient has a CPAP machine.  She told me that she thinks it was not working.  However her daughter states that the machine does not work.  They have contacted [employer] [facility] already who will be sending her her supplies.  When she receives the supplies, her daughter states that [employer] states that they will work with her in resetting the machine for her.  Patient complains of having a boil on the left buttock that started after she left the hospital.  It is red and somewhat itchy.  She does not think it is shingles.  She notes that it is filled with pus.  It is not draining.  She has covered it with gauze.  Her daughter estimates that it is about the size of 4 quarters put together.     Observations/Objective: Lab Results  Component Value Date   WBC 7.1 04/25/2020   HGB 11.8 (L)  04/25/2020   HCT 39.3 04/25/2020   MCV 75.6 (L) 04/25/2020   PLT 265 04/25/2020     Chemistry      Component Value Date/Time   NA 144 04/25/2020 0320   K 3.5 04/25/2020 0320   CL 102 04/25/2020 0320   CO2 34 (H) 04/25/2020 0320   BUN 28 (H) 04/25/2020 0320   CREATININE 1.17 (H) 04/25/2020 0320      Component Value Date/Time   CALCIUM 9.1 04/25/2020 0320   ALKPHOS 44 04/25/2020 0320   AST 19 04/25/2020 0320   ALT 22 04/25/2020 0320   BILITOT 0.6 04/25/2020 0320       Assessment and Plan: 1. Hospital discharge follow-up   2.  Pneumonia due to COVID-19 virus Clinically improved.   Pt has completed 2 COVID-19 vaccine shot.  Bring card with her on f/u visit.  Continue to wear mask in public  3. Acute deep vein thrombosis (DVT) of left lower extremity, unspecified vein (HCC) Likely due to COVID-19 infection.  Plan for 3 mths of Eliquis.  Advise to report any increase bruising/bleeding - apixaban (ELIQUIS) 5 MG TABS tablet; Take 1 tablet (5 mg total) by mouth 2 (two) times daily.  Dispense: 60 tablet; Refill: 1  4. Type 2 diabetes mellitus with morbid obesity (HCC) Continue current dose of Novolog and Levemir.  Advise that when she checks BS after meals, it should be 2 hrs after meals.  Went over BS goal as 90-130 before meals and less than 180 post meals - insulin aspart (NOVOLOG) 100 UNIT/ML FlexPen; Inject 4 Units into the skin 3 (three) times daily with meals.  Dispense: 15 mL; Refill: 6 - atorvastatin (LIPITOR) 40 MG tablet; Take 1 tablet (40 mg total) by mouth daily.  Dispense: 30 tablet; Refill: 6 - insulin detemir (LEVEMIR) 100 UNIT/ML FlexPen; Inject 10 Units into the skin daily.  Dispense: 15 mL; Refill: 6 - Insulin Pen Needle (PEN NEEDLES 3/16") 31G X 5 MM MISC; 1 Device by Does not apply route QID.  Dispense: 100 each; Refill: 6 - glucose blood (TRUE METRIX BLOOD GLUCOSE TEST) test strip; Use as instructed  Dispense: 100 each; Refill: 12 - TRUEplus Lancets 28G MISC; Use as directed  Dispense: 100 each; Refill: 4  5. OSA (obstructive sleep apnea) Patient awaiting supplies for her CPAP machine from [employer].  Daughter states that they will calibrate the machine for her when she gets her supplies.  6. Acute renal insufficiency This is improved by the time of hospital discharge.  We will recheck renal function on her in person visit with me.  7. Diastolic dysfunction - carvedilol (COREG) 3.125 MG tablet; Take 1 tablet (3.125 mg total) by mouth 2 (two) times daily with a meal.  Dispense: 60 tablet; Refill: 6  8.  Abscess Advised patient that this can advance quickly especially in patients with diabetes.  Recommend that she be seen in the emergency room to have this lanced.  I have prescribed some antibiotics for her - sulfamethoxazole-trimethoprim (BACTRIM DS) 800-160 MG tablet; Take 1 tablet by mouth 2 (two) times daily.  Dispense: 14 tablet; Refill: 0  Daughter tells me that patient had Medicaid in [place] and they are working on getting that transferred to [facility].  Follow Up Instructions: 2-3 wks in person   I discussed the assessment and treatment plan with the patient. The patient was provided an opportunity to ask questions and all were answered. The patient agreed with the plan and demonstrated an understanding of  the instructions.   The patient was advised to call back or seek an in-person evaluation if the symptoms worsen or if the condition fails to improve as anticipated.  I provided 28 minutes of non-face-to-face time during this encounter.   Jonah Blue, MD

## 2020-05-08 ENCOUNTER — Ambulatory Visit: Payer: Self-pay

## 2020-05-28 ENCOUNTER — Telehealth: Payer: Self-pay | Admitting: Internal Medicine

## 2020-05-28 ENCOUNTER — Other Ambulatory Visit: Payer: Self-pay | Admitting: Pharmacist

## 2020-05-28 ENCOUNTER — Other Ambulatory Visit: Payer: Self-pay | Admitting: Internal Medicine

## 2020-05-28 DIAGNOSIS — I82402 Acute embolism and thrombosis of unspecified deep veins of left lower extremity: Secondary | ICD-10-CM

## 2020-05-28 MED ORDER — APIXABAN 5 MG PO TABS: 5.0000 mg | ORAL_TABLET | Freq: Two times a day (BID) | ORAL | 1 refills | Status: DC

## 2020-05-28 MED FILL — !ELIQUIS 5 MG TABLET: 5 | 30 days supply | Qty: 60 | Fill #0

## 2020-05-28 NOTE — Telephone Encounter (Signed)
Would you be able to change

## 2020-05-28 NOTE — Telephone Encounter (Signed)
Daughter calling to request another alternative to apixaban (ELIQUIS) 5 MG TABS tablet.  It is $600 at [facility].  Please advise, because pt does not have any left. Took last pill Sat.   Pt has appt 05/31/20. And will need [employer] Goad ride when she leaves.

## 2020-05-28 NOTE — Telephone Encounter (Signed)
Patient is uninsured and needs to fill her Eliquis here. I've resent the rx here to our pharmacy. We will sign her up for patient assistance so she can continue the Eliquis.

## 2020-05-29 ENCOUNTER — Other Ambulatory Visit: Payer: Self-pay | Admitting: Internal Medicine

## 2020-05-29 DIAGNOSIS — E1169 Type 2 diabetes mellitus with other specified complication: Secondary | ICD-10-CM

## 2020-05-29 MED ORDER — TRUE METRIX BLOOD GLUCOSE TEST VI STRP
ORAL_STRIP | 12 refills | Status: AC
  Filled 2020-07-20: qty 100, 50d supply, fill #0
  Filled 2020-07-30: qty 100, 16d supply, fill #1
  Filled 2020-08-21: qty 100, 16d supply, fill #2
  Filled 2020-09-10: qty 100, 16d supply, fill #3
  Filled 2020-09-28: qty 100, 25d supply, fill #4
  Filled 2020-11-01: qty 100, 25d supply, fill #5

## 2020-05-29 MED ORDER — INSULIN DETEMIR 100 UNIT/ML FLEXPEN: 10.0000 [IU] | PEN_INJECTOR | Freq: Every day | SUBCUTANEOUS | 6 refills | Status: DC

## 2020-05-29 MED ORDER — INSULIN PEN NEEDLE 31G X 5 MM MISC
1.0000 | Freq: Four times a day (QID) | 6 refills | Status: AC
  Filled 2020-08-28: qty 100, 25d supply, fill #0
  Filled 2020-11-01: qty 100, 25d supply, fill #1

## 2020-05-29 MED ORDER — INSULIN ASPART 100 UNIT/ML FLEXPEN: 4.0000 [IU] | PEN_INJECTOR | Freq: Three times a day (TID) | SUBCUTANEOUS | 6 refills | Status: DC

## 2020-05-29 MED ORDER — TRUEPLUS LANCETS 28G MISC
4 refills | Status: AC
  Filled 2020-07-31: qty 100, 30d supply, fill #0
  Filled 2020-08-28: qty 100, 30d supply, fill #1
  Filled 2020-09-28: qty 100, 25d supply, fill #2
  Filled 2020-11-01: qty 100, 25d supply, fill #3

## 2020-05-29 MED FILL — TRUEplus LANCETS 28G MISC: 30 days supply | Qty: 100 | Fill #0

## 2020-05-29 MED FILL — !NOVOLOG FLEXPEN SYRINGE 1: 100/ML | 25 days supply | Qty: 3 | Fill #0

## 2020-05-29 MED FILL — TRUE METRIX GLUCOSE TEST ST: 30 days supply | Qty: 100 | Fill #0

## 2020-05-29 MED FILL — !LEVEMIR FLEXPEN 100UNITS/M: 100U/ML (3) | 30 days supply | Qty: 3 | Fill #0

## 2020-05-29 MED FILL — TRUEPLUS 5-BEVEL PEN NEEDLE: 31G X 5 MM | 25 days supply | Qty: 100 | Fill #0

## 2020-05-29 NOTE — Telephone Encounter (Signed)
Medication Refill - Medication: insulin aspart (NOVOLOG) 100 UNIT/ML FlexPen  insulin detemir (LEVEMIR) 100 UNIT/ML FlexPen  Insulin Pen Needle (PEN NEEDLES 3/16") 31G X 5 MM MISC glucose blood (TRUE METRIX BLOOD GLUCOSE TEST) test strip Insulin Pen Needle (PEN NEEDLES 3/16") 31G X 5 MM MISC     Preferred Pharmacy (with phone number or street name):  [facility] - [place], [place] - [place] [address] Phone:  [phone]  Fax:  [phone]       Agent: Please be advised that RX refills may take up to 3 business days. We ask that you follow-up with your pharmacy.

## 2020-05-31 ENCOUNTER — Other Ambulatory Visit: Payer: Self-pay | Admitting: Internal Medicine

## 2020-05-31 ENCOUNTER — Ambulatory Visit: Payer: Self-pay | Attending: Internal Medicine | Admitting: Internal Medicine

## 2020-05-31 ENCOUNTER — Other Ambulatory Visit: Payer: Self-pay

## 2020-05-31 DIAGNOSIS — R9431 Abnormal electrocardiogram [ECG] [EKG]: Secondary | ICD-10-CM | POA: Insufficient documentation

## 2020-05-31 DIAGNOSIS — Z7901 Long term (current) use of anticoagulants: Secondary | ICD-10-CM | POA: Insufficient documentation

## 2020-05-31 DIAGNOSIS — I110 Hypertensive heart disease with heart failure: Secondary | ICD-10-CM | POA: Insufficient documentation

## 2020-05-31 DIAGNOSIS — M79605 Pain in left leg: Secondary | ICD-10-CM | POA: Insufficient documentation

## 2020-05-31 DIAGNOSIS — M25562 Pain in left knee: Secondary | ICD-10-CM | POA: Insufficient documentation

## 2020-05-31 DIAGNOSIS — M1712 Unilateral primary osteoarthritis, left knee: Secondary | ICD-10-CM | POA: Insufficient documentation

## 2020-05-31 DIAGNOSIS — Z794 Long term (current) use of insulin: Secondary | ICD-10-CM | POA: Insufficient documentation

## 2020-05-31 DIAGNOSIS — R000 Tachycardia, unspecified: Secondary | ICD-10-CM | POA: Insufficient documentation

## 2020-05-31 DIAGNOSIS — E1169 Type 2 diabetes mellitus with other specified complication: Secondary | ICD-10-CM | POA: Insufficient documentation

## 2020-05-31 DIAGNOSIS — Z8616 Personal history of COVID-19: Secondary | ICD-10-CM | POA: Insufficient documentation

## 2020-05-31 DIAGNOSIS — Z79899 Other long term (current) drug therapy: Secondary | ICD-10-CM | POA: Insufficient documentation

## 2020-05-31 DIAGNOSIS — I5189 Other ill-defined heart diseases: Secondary | ICD-10-CM

## 2020-05-31 DIAGNOSIS — G4733 Obstructive sleep apnea (adult) (pediatric): Secondary | ICD-10-CM | POA: Insufficient documentation

## 2020-05-31 DIAGNOSIS — D509 Iron deficiency anemia, unspecified: Secondary | ICD-10-CM | POA: Insufficient documentation

## 2020-05-31 DIAGNOSIS — Z6841 Body Mass Index (BMI) 40.0 and over, adult: Secondary | ICD-10-CM | POA: Insufficient documentation

## 2020-05-31 DIAGNOSIS — I10 Essential (primary) hypertension: Secondary | ICD-10-CM

## 2020-05-31 DIAGNOSIS — R0602 Shortness of breath: Secondary | ICD-10-CM | POA: Insufficient documentation

## 2020-05-31 DIAGNOSIS — I509 Heart failure, unspecified: Secondary | ICD-10-CM | POA: Insufficient documentation

## 2020-05-31 DIAGNOSIS — I82402 Acute embolism and thrombosis of unspecified deep veins of left lower extremity: Secondary | ICD-10-CM | POA: Insufficient documentation

## 2020-05-31 LAB — GLUCOSE, POCT (MANUAL RESULT ENTRY): POC Glucose: 166 mg/dl — AB (ref 70–99)

## 2020-05-31 MED ORDER — APIXABAN 5 MG PO TABS: 5.0000 mg | ORAL_TABLET | Freq: Two times a day (BID) | ORAL | 1 refills | Status: DC

## 2020-05-31 MED ORDER — DICLOFENAC SODIUM 1 % EX GEL: 2.0000 g | Freq: Four times a day (QID) | CUTANEOUS | 2 refills | Status: DC

## 2020-05-31 MED ORDER — ALBUTEROL SULFATE HFA 108 (90 BASE) MCG/ACT IN AERS: 1.0000 | INHALATION_SPRAY | Freq: Four times a day (QID) | RESPIRATORY_TRACT | 6 refills | Status: AC | PRN

## 2020-05-31 MED ORDER — FUROSEMIDE 20 MG PO TABS
20.0000 mg | ORAL_TABLET | Freq: Every day | ORAL | 5 refills | Status: AC
  Filled 2020-07-31: qty 30, 30d supply, fill #0
  Filled 2020-08-28: qty 30, 30d supply, fill #1
  Filled 2020-09-28: qty 30, 30d supply, fill #2
  Filled 2020-11-01: qty 30, 30d supply, fill #3

## 2020-05-31 MED ORDER — INSULIN DETEMIR 100 UNIT/ML FLEXPEN
10.0000 [IU] | PEN_INJECTOR | Freq: Every day | SUBCUTANEOUS | 6 refills | Status: AC
  Filled 2020-07-31: qty 3, 30d supply, fill #0
  Filled 2020-08-28: qty 3, 30d supply, fill #1
  Filled 2020-09-28: qty 3, 30d supply, fill #2
  Filled 2020-11-01: qty 3, 30d supply, fill #3

## 2020-05-31 MED ORDER — ATORVASTATIN CALCIUM 40 MG PO TABS
40.0000 mg | ORAL_TABLET | Freq: Every day | ORAL | 6 refills | Status: AC
  Filled 2020-07-31: qty 30, 30d supply, fill #0
  Filled 2020-08-28: qty 30, 30d supply, fill #1
  Filled 2020-09-28: qty 30, 30d supply, fill #2
  Filled 2020-11-01: qty 30, 30d supply, fill #3

## 2020-05-31 MED ORDER — INSULIN ASPART 100 UNIT/ML FLEXPEN
4.0000 [IU] | PEN_INJECTOR | Freq: Three times a day (TID) | SUBCUTANEOUS | 6 refills | Status: DC
  Filled 2020-07-31: qty 3, 25d supply, fill #0
  Filled 2020-08-21: qty 3, 25d supply, fill #1
  Filled 2020-11-01: qty 3, 25d supply, fill #2

## 2020-05-31 MED ORDER — CARVEDILOL 3.125 MG PO TABS
3.1250 mg | ORAL_TABLET | Freq: Two times a day (BID) | ORAL | 6 refills | Status: AC
  Filled 2020-07-31: qty 60, 30d supply, fill #0
  Filled 2020-08-28: qty 60, 30d supply, fill #1
  Filled 2020-09-28: qty 60, 30d supply, fill #2
  Filled 2020-11-01: qty 60, 30d supply, fill #3

## 2020-05-31 MED FILL — DICLOFENAC SODIUM 1% GEL: 1 | 12 days supply | Qty: 100 | Fill #0

## 2020-05-31 MED FILL — ALBUTEROL SULFATE HFA 108 (: 108 (90 BAS | 25 days supply | Qty: 18 | Fill #0

## 2020-05-31 MED FILL — ATORVASTATIN CALCIUM 40 MG: 40 | 30 days supply | Qty: 30 | Fill #0

## 2020-05-31 MED FILL — CARVEDILOL 3.125 MG TABLET: 3.125 | 30 days supply | Qty: 60 | Fill #0

## 2020-05-31 MED FILL — FUROSEMIDE 20 MG TABS: 20 | 30 days supply | Qty: 30 | Fill #0

## 2020-05-31 NOTE — Progress Notes (Signed)
Patient ID: Chelsea Norton, female    DOB: December 26, 1950  MRN: [id number]  CC: Follow-up (3 week )   Subjective: Chelsea Norton is a 70 y.o. female who presents for chronic ds management 3 wks f/u Her concerns today include:  Patient with history of Covid, DVT LLE associated with COVID, DM type II, HL, OHS, OSA, obesity, CHF with EF  55%, AKI.   I had an initial telephone visit with her 3 weeks ago posthospitalization for Covid and DVT.  Boil LT buttock: I had prescribed Bactrim.  She did not get the oral abx.  She used OTC triple abx ointment.  She states that it has resolved.  OSA/OHS: [facility] did send her supplies and got the machine working.  She is using the CPAP machine every night.  She feels rested in the mornings.  DM: checks BS 6x/day before and after meals.  She forgot to bring her log.  Reports BS have been good.  No low BS episode.  Can tell when BS low.  Due for eye exam.  Wears glasses.  DVT:  On ELiquis for DVT in the left lower extremity that developed during Covid infection in January of this year.  No bleeding. Occasional brusing.  Sometimes she feels a shotting pain in LT leg lasting a few mins  HTN/diastolic dysfunction: Gives history of HTN.  Reports being on Cozaar in the past but I think it may have been stopped when she went into the hospital and had acute renal insufficiency.  She was discharged on carvedilol.  Reports compliance with taking the carvedilol but out of it x1 day.   -Some SOB and audible wheezing if she moves around too quiclky.  Has an incentive spirometry that she uses a few times a day.  Reports hx of bronchitis.  Was placed on Albuterol about 5 yrs ago to use PRN.  Currently out. No PND/orthopnea.  She can sometimes hear her heart beating fast in her ear.   Some swelling in ankles LT>RT  Walks with cane.  Also has walker.  Pain in left knee for a while but worse lately.  Sometimes feels like knee wants to give out.  Told in past that  she has arthritis in the left knee  Anemia: Noted to have microcytic anemia.  Placed on iron on discharge from the hospital in January.  Prior to that patient states that she was not on iron.  She takes the iron supplement daily but does not like taking it because it causes constipation.  HM: Had Medicaid in [place].  She has trying to get it transferred.  Last MMG was about 2 yrs ago.  Reports having had colonoscopy about 3 yrs ago.  Had a polyp removed..   Patient Active Problem List   Diagnosis Date Noted  . Acute deep vein thrombosis (DVT) of left lower extremity ([place]) 05/01/2020  . Type 2 diabetes mellitus with morbid obesity ([place]) 05/01/2020  . OSA (obstructive sleep apnea) 05/01/2020  . Diastolic dysfunction 58/52/7782  . Acute respiratory failure due to COVID-19 ([place]) 04/19/2020  . Respiratory failure ([place]) 04/19/2020     Current Outpatient Medications on File Prior to Visit  Medication Sig Dispense Refill  . albuterol (VENTOLIN HFA) 108 (90 Base) MCG/ACT inhaler Inhale 1-2 puffs into the lungs every 6 (six) hours as needed for wheezing or shortness of breath.    Marland Kitchen apixaban (ELIQUIS) 5 MG TABS tablet Take 1 tablet (5 mg total) by mouth 2 (two) times  daily. 60 tablet 1  . Ascorbic Acid (VITAMIN C) 1000 MG tablet Take 1,000 mg by mouth daily.    Marland Kitchen atorvastatin (LIPITOR) 40 MG tablet Take 1 tablet (40 mg total) by mouth daily. 30 tablet 6  . b complex vitamins capsule Take 1 capsule by mouth daily.    . blood glucose meter kit and supplies KIT Dispense based on patient and insurance preference. Use up to four times daily as directed. (FOR ICD-9 250.00, 250.01). 1 each 0  . carvedilol (COREG) 3.125 MG tablet Take 1 tablet (3.125 mg total) by mouth 2 (two) times daily with a meal. 60 tablet 6  . Coenzyme Q10 (COQ10) 30 MG CAPS Take 30 mg by mouth daily.    . ferrous sulfate 325 (65 FE) MG tablet Take 325 mg by mouth daily with breakfast.    . glucose blood (TRUE METRIX BLOOD GLUCOSE TEST)  test strip Use as instructed 100 each 12  . insulin aspart (NOVOLOG) 100 UNIT/ML FlexPen Inject 4 Units into the skin 3 (three) times daily with meals. 15 mL 6  . insulin detemir (LEVEMIR) 100 UNIT/ML FlexPen Inject 10 Units into the skin daily. 15 mL 6  . Insulin Pen Needle (PEN NEEDLES 3/16") 31G X 5 MM MISC 1 Device by Does not apply route QID. 100 each 6  . Omega-3 Fatty Acids (FISH OIL) 1000 MG CAPS Take 1,000 mg by mouth daily.    Marland Kitchen OVER THE COUNTER MEDICATION Take 400 mg by mouth daily. echinacea    . sulfamethoxazole-trimethoprim (BACTRIM DS) 800-160 MG tablet Take 1 tablet by mouth 2 (two) times daily. 14 tablet 0  . TRUEplus Lancets 28G MISC Use as directed 100 each 4  . Turmeric (QC TUMERIC COMPLEX) 500 MG CAPS Take 500 mg by mouth daily.    . Vitamin D, Cholecalciferol, 25 MCG (1000 UT) TABS Take 1,000 Units by mouth daily.    . vitamin k 100 MCG tablet Take 100 mcg by mouth daily.    . Zinc 50 MG CAPS Take 50 mg by mouth daily.     No current facility-administered medications on file prior to visit.    No Known Allergies  Social History   Socioeconomic History  . Marital status: Single    Spouse name: Not on file  . Number of children: Not on file  . Years of education: Not on file  . Highest education level: Not on file  Occupational History  . Not on file  Tobacco Use  . Smoking status: Not on file  . Smokeless tobacco: Not on file  Substance and Sexual Activity  . Alcohol use: Not on file  . Drug use: Not on file  . Sexual activity: Not on file  Other Topics Concern  . Not on file  Social History Narrative  . Not on file   Social Determinants of Health   Financial Resource Strain: Not on file  Food Insecurity: Not on file  Transportation Needs: Not on file  Physical Activity: Not on file  Stress: Not on file  Social Connections: Not on file  Intimate Partner Violence: Not on file      ROS: Review of Systems Negative except as stated  above  PHYSICAL EXAM: BP (!) 169/92   Pulse (!) 104   Resp 16   Wt 254 lb 3.2 oz (115.3 kg)   SpO2 95%   BMI 43.63 kg/m   BP 170/100 Physical Exam  General appearance - alert, well appearing, obese older  female and in no distress.  She ambulates with a cane Mental status - normal mood, behavior, speech, dress, motor activity, and thought processes Mouth - mucous membranes moist, pharynx normal without lesions Neck - supple, no significant adenopathy Chest - clear to auscultation, no wheezes, rales or rhonchi, symmetric air entry Heart -tachycardic but regular.  No murmurs appreciated. Extremities -trace to 1+ edema of the lower legs.  1+ edema of the ankles. MSK: Left knee: Mild enlargement of the joint.  No point tenderness.  She has moderate crepitus on passive range of motion.  Right knee: Mild joint enlargement.  Minimal crepitus.  Good range of motion.   CMP Latest Ref Rng & Units 04/25/2020 04/24/2020 04/23/2020  Glucose 70 - 99 mg/dL 72 137(H) 108(H)  BUN 8 - 23 mg/dL 28(H) 28(H) 32(H)  Creatinine 0.44 - 1.00 mg/dL 1.17(H) 1.31(H) 1.20(H)  Sodium 135 - 145 mmol/L 144 141 139  Potassium 3.5 - 5.1 mmol/L 3.5 3.8 3.5  Chloride 98 - 111 mmol/L 102 96(L) 98  CO2 22 - 32 mmol/L 34(H) 35(H) 32  Calcium 8.9 - 10.3 mg/dL 9.1 8.9 8.7(L)  Total Protein 6.5 - 8.1 g/dL 5.9(L) 6.0(L) -  Total Bilirubin 0.3 - 1.2 mg/dL 0.6 0.8 -  Alkaline Phos 38 - 126 U/L 44 47 -  AST 15 - 41 U/L 19 20 -  ALT 0 - 44 U/L 22 22 -   Lipid Panel     Component Value Date/Time   TRIG 119 04/19/2020 0235    CBC    Component Value Date/Time   WBC 7.1 04/25/2020 0320   RBC 5.20 (H) 04/25/2020 0320   HGB 11.8 (L) 04/25/2020 0320   HCT 39.3 04/25/2020 0320   PLT 265 04/25/2020 0320   MCV 75.6 (L) 04/25/2020 0320   MCH 22.7 (L) 04/25/2020 0320   MCHC 30.0 04/25/2020 0320   RDW 15.5 04/25/2020 0320   LYMPHSABS 1.8 04/25/2020 0320   MONOABS 0.7 04/25/2020 0320   EOSABS 0.2 04/25/2020 0320    BASOSABS 0.0 04/25/2020 0320   EKG: Normal sinus rhythm, mild LVH, Q waves in the inferior leads III and aVF that appears new compared to EKG done in January of this year.  Heart rate is 95.  ASSESSMENT AND PLAN: 1. Type 2 diabetes mellitus with morbid obesity ([place]) Patient to continue Levemir and NovoLog.  Continue healthy eating habits.  Advised to bring blood sugar log readings with her on next visit. - POCT glucose (manual entry) - Microalbumin / creatinine urine ratio - Lipid panel - insulin aspart (NOVOLOG) 100 UNIT/ML FlexPen; Inject 4 Units into the skin 3 (three) times daily with meals.  Dispense: 15 mL; Refill: 6 - atorvastatin (LIPITOR) 40 MG tablet; Take 1 tablet (40 mg total) by mouth daily.  Dispense: 30 tablet; Refill: 6 - CBC - insulin detemir (LEVEMIR) 100 UNIT/ML FlexPen; Inject 10 Units into the skin daily.  Dispense: 15 mL; Refill: 6 - Ambulatory referral to Ophthalmology  2. Essential hypertension Not at goal.  She has been out of carvedilol for 1 day.  Refill carvedilol.  I have added a low-dose of furosemide given the lower extremity edema.  Follow-up with clinical pharmacist in 1 week for repeat blood pressure check. - Basic Metabolic Panel - furosemide (LASIX) 20 MG tablet; Take 1 tablet (20 mg total) by mouth daily.  Dispense: 30 tablet; Refill: 5 - carvedilol (COREG) 3.125 MG tablet; Take 1 tablet (3.125 mg total) by mouth 2 (two) times daily with  a meal.  Dispense: 60 tablet; Refill: 6  3. OSA (obstructive sleep apnea) Doing well on CPAP.  Encouraged her to continue using it every night  4. Primary osteoarthritis of left knee Stressed importance of weight loss. Recommend using Tylenol over-the-counter as needed. - diclofenac Sodium (VOLTAREN) 1 % GEL; Apply 2 g topically 4 (four) times daily.  Dispense: 100 g; Refill: 2  5. Tachycardia 6. Diastolic dysfunction 7. Abnormal EKG -DASH diet discussed and encouraged. Good blood pressure control needed.  Refill  given on carvedilol.  If carvedilol causes increased wheezing for her we can change it to Bystolic - TSH - EKG 52-DPOE - Brain natriuretic peptide - furosemide (LASIX) 20 MG tablet; Take 1 tablet (20 mg total) by mouth daily.  Dispense: 30 tablet; Refill: 5 - Ambulatory referral to Cardiology  8. Acute deep vein thrombosis (DVT) of left lower extremity, unspecified vein ([facility]) -The end of April will be 3 months of being on the Eliquis.  We will repeat Doppler ultrasound at that time and is good we will plan to discontinue the Eliquis. - apixaban (ELIQUIS) 5 MG TABS tablet; Take 1 tablet (5 mg total) by mouth 2 (two) times daily.  Dispense: 60 tablet; Refill: 1 - VAS [name] LOWER EXTREMITY VENOUS (DVT); Future  9. Microcytic anemia Recheck CBC today.  I will also check iron studies and hemoglobin electrophoresis to screen for sickle cell trait - Iron, TIBC and Ferritin Panel - Hgb Fractionation [place]  I asked the patient to sign a release so that we can get her medical records from previous PCP in [place].  Patient was given the opportunity to ask questions.  Patient verbalized understanding of the plan and was able to repeat key elements of the plan.   Orders Placed This Encounter  Procedures  . POCT glucose (manual entry)     Requested Prescriptions    No prescriptions requested or ordered in this encounter    No follow-ups on file.  Karle Plumber, MD, FACP

## 2020-05-31 NOTE — Patient Instructions (Signed)
Your blood pressure is elevated.  I have refilled your carvedilol.  We have added another medication called furosemide which will also help decrease the blood pressure but will also help decrease swelling in the legs.  You had a heart tracing today called an EKG that was abnormal.  I have referred you to the cardiologist for further evaluation.  Use the Voltaren gel as needed to help with arthritis pain in the left knee.  You can take Tylenol over-the-counter as needed also.  Do not take ibuprofen, Aleve, Advil, Motrin or Naprosyn.  We have ordered a Doppler ultrasound on the left leg to be done the end of April to see if the clots have resolved.  If they have we will be able to stop the Eliquis at that time.

## 2020-06-01 NOTE — Progress Notes (Signed)
Let patient know that she has a small amount of protein in the urine.  This can be an early indication of diabetes affecting the kidneys.  If this persists on recheck, we will add the medication called Losartan which she was on before for blood pressure as it helps to protect the kidneys from the effects of diabetes.  Kidney function has improved compared to when she was hospitalized in January.  Thyroid level is normal.  Cholesterol level normal.  Blood count has improved.  She is no longer anemic.  Iron level is good.

## 2020-06-04 LAB — LIPID PANEL
Chol/HDL Ratio: 2.9 ratio (ref 0.0–4.4)
Cholesterol, Total: 136 mg/dL (ref 100–199)
HDL: 47 mg/dL (ref >39)
LDL Chol Calc (NIH): 72 mg/dL (ref 0–99)
Triglycerides: 92 mg/dL (ref 0–149)
VLDL Cholesterol Cal: 17 mg/dL (ref 5–40)

## 2020-06-04 LAB — BASIC METABOLIC PANEL
BUN/Creatinine Ratio: 13 (ref 12–28)
BUN: 13 mg/dL (ref 8–27)
CO2: 27 mmol/L (ref 20–29)
Calcium: 9.9 mg/dL (ref 8.7–10.3)
Chloride: 104 mmol/L (ref 96–106)
Creatinine, Ser: 1 mg/dL (ref 0.57–1.00)
Glucose: 126 mg/dL — ABNORMAL HIGH (ref 65–99)
Potassium: 4.3 mmol/L (ref 3.5–5.2)
Sodium: 146 mmol/L — ABNORMAL HIGH (ref 134–144)
eGFR: 61 mL/min/{1.73_m2} (ref >59)

## 2020-06-04 LAB — CBC
Hematocrit: 40.9 % (ref 34.0–46.6)
Hemoglobin: 12.2 g/dL (ref 11.1–15.9)
MCH: 22.6 pg — ABNORMAL LOW (ref 26.6–33.0)
MCHC: 29.8 g/dL — ABNORMAL LOW (ref 31.5–35.7)
MCV: 76 fL — ABNORMAL LOW (ref 79–97)
Platelets: 241 10*3/uL (ref 150–450)
RBC: 5.41 x10E6/uL — ABNORMAL HIGH (ref 3.77–5.28)
RDW: 15.8 % — ABNORMAL HIGH (ref 11.7–15.4)
WBC: 8 10*3/uL (ref 3.4–10.8)

## 2020-06-04 LAB — HGB FRACTIONATION CASCADE
Hgb A2: 2.3 % (ref 1.8–3.2)
Hgb A: 97.7 % (ref 96.4–98.8)
Hgb F: 0 % (ref 0.0–2.0)
Hgb S: 0 %

## 2020-06-04 LAB — TSH: TSH: 2.76 u[IU]/mL (ref 0.450–4.500)

## 2020-06-04 LAB — IRON,TIBC AND FERRITIN PANEL
Ferritin: 104 ng/mL (ref 15–150)
Iron Saturation: 31 % (ref 15–55)
Iron: 91 ug/dL (ref 27–139)
Total Iron Binding Capacity: 298 ug/dL (ref 250–450)
UIBC: 207 ug/dL (ref 118–369)

## 2020-06-04 LAB — MICROALBUMIN / CREATININE URINE RATIO
Creatinine, Urine: 87.1 mg/dL
Microalb/Creat Ratio: 74 mg/g creat — ABNORMAL HIGH (ref 0–29)
Microalbumin, Urine: 64.2 ug/mL

## 2020-06-04 LAB — BRAIN NATRIURETIC PEPTIDE: BNP: 17.7 pg/mL (ref 0.0–100.0)

## 2020-06-15 ENCOUNTER — Ambulatory Visit: Payer: Self-pay | Attending: Internal Medicine | Admitting: Pharmacist

## 2020-06-15 ENCOUNTER — Other Ambulatory Visit: Payer: Self-pay

## 2020-06-15 ENCOUNTER — Encounter: Payer: Self-pay | Admitting: Pharmacist

## 2020-06-15 ENCOUNTER — Other Ambulatory Visit: Payer: Self-pay | Admitting: Internal Medicine

## 2020-06-15 VITALS — BP 182/118

## 2020-06-15 DIAGNOSIS — I10 Essential (primary) hypertension: Secondary | ICD-10-CM

## 2020-06-15 MED ORDER — LOSARTAN POTASSIUM 25 MG PO TABS: 25.0000 mg | ORAL_TABLET | Freq: Every day | ORAL | 1 refills | Status: DC

## 2020-06-15 NOTE — Progress Notes (Signed)
   S:    PCP: Dr. Laural Benes  Patient arrives in good spirits. Presents to the clinic for hypertension evaluation, counseling, and management. Patient was referred and last seen by Primary Care Provider on 05/31/2020.   Medication adherence reported.  Patient denies chest pain, dyspnea, HA, blurred vision.   Current BP Medications include:  Carvedilol 3.125 mg BID, furosemide 20 mg daily    Dietary habits include: compliant with salt restriction; denies drinking excess caffeine  Exercise habits include: limited  Family / Social history:  - FHx: no pertinent positives  - Tobacco: never smoker  - Alcohol: denies use   O:  Vitals:   06/15/20 1552  BP: (!) 182/118    Home BP readings: unreliable; cuff is inaccurate   Last 3 Office BP readings: BP Readings from Last 3 Encounters:  06/15/20 (!) 182/118  05/31/20 (!) 169/92  04/25/20 (!) 169/88    BMET    Component Value Date/Time   NA 146 (H) 05/31/2020 1201   K 4.3 05/31/2020 1201   CL 104 05/31/2020 1201   CO2 27 05/31/2020 1201   GLUCOSE 126 (H) 05/31/2020 1201   GLUCOSE 72 04/25/2020 0320   BUN 13 05/31/2020 1201   CREATININE 1.00 05/31/2020 1201   CALCIUM 9.9 05/31/2020 1201   GFRNONAA 51 (L) 04/25/2020 0320    Renal function: CrCl cannot be calculated (Unknown ideal weight.).  Clinical ASCVD: No  The ASCVD Risk score Denman George DC Jr., et al., 2013) failed to calculate for the following reasons:   The smoking status is invalid   A/P: Hypertension longstanding currently uncontrolled on current medications. BP Goal = < 130/80 mmHg. Medication adherence reported. Would like to add amlodipine but pt has LE edema that we are managing with Lasix. She has T2DM with a compelling urine microalbumin. Will add losartan. She has a hx with this medication - it was stopped during her hospitalization d/t renal dysfunction. BMP from 2 weeks ago shows normal renal function.  -Started losartan 25 mg daily. -Continue carvedilol 3.125  mg BID, furosemide for LE edema -F/u labs ordered - will plan on BMP in 4-6 weeks.  -Counseled on lifestyle modifications for blood pressure control including reduced dietary sodium, increased exercise, adequate sleep.  Results reviewed and written information provided.   Total time in face-to-face counseling 30 minutes.  F/U Clinic Visit in 2 weeks.   Butch Penny, PharmD, Patsy [place], CPP Clinical Pharmacist [facility] & [facility] [phone]

## 2020-06-15 NOTE — Patient Instructions (Signed)
Thank you for coming to see [name] today.   Blood pressure today is very high.  Continue taking carvedilol and Lasix.   Start taking losartan. Take 1 tablet once a day in the evening before bed.   Limiting salt and caffeine, as well as exercising as able for at least 30 minutes for 5 days out of the week, can also help you lower your blood pressure.  Take your blood pressure at home if you are able. Please write down these numbers and bring them to your visits.  If you have any questions about medications, please call me [phone].  Franky Macho

## 2020-06-19 ENCOUNTER — Ambulatory Visit: Payer: Self-pay | Admitting: Cardiology

## 2020-06-27 MED FILL — ATORVASTATIN CALCIUM 40 MG: 40 | 30 days supply | Qty: 30 | Fill #1

## 2020-06-27 MED FILL — CARVEDILOL 3.125 MG TABLET: 3.125 | 30 days supply | Qty: 60 | Fill #1

## 2020-06-27 MED FILL — !ELIQUIS 5 MG TABLET: 5 | 30 days supply | Qty: 60 | Fill #1

## 2020-06-27 MED FILL — FUROSEMIDE 20 MG TABS: 20 | 30 days supply | Qty: 30 | Fill #1

## 2020-06-29 ENCOUNTER — Other Ambulatory Visit: Payer: Self-pay

## 2020-06-29 ENCOUNTER — Ambulatory Visit: Payer: Self-pay | Attending: Internal Medicine | Admitting: Pharmacist

## 2020-06-29 ENCOUNTER — Other Ambulatory Visit: Payer: Self-pay | Admitting: Internal Medicine

## 2020-06-29 VITALS — BP 182/68 | HR 87

## 2020-06-29 DIAGNOSIS — I10 Essential (primary) hypertension: Secondary | ICD-10-CM

## 2020-06-29 MED ORDER — LOSARTAN POTASSIUM 50 MG PO TABS: 50.0000 mg | ORAL_TABLET | Freq: Every day | ORAL | 2 refills | Status: DC

## 2020-06-29 MED FILL — LOSARTAN POTASSIUM 50 MG TA: 50 | 30 days supply | Qty: 30 | Fill #0

## 2020-06-29 NOTE — Progress Notes (Signed)
   S:    PCP: Dr. Laural Benes  Patient arrives in good spirits. Presents to the clinic for hypertension evaluation, counseling, and management. Patient was referred and last seen by Primary Care Provider on 05/31/2020. I saw her on 06/15/2020 and started losartan.   Medication adherence reported. Denies side effects to the losartan.  Patient denies chest pain, dyspnea, HA, blurred vision.   Current BP Medications include:  Carvedilol 3.125 mg BID, furosemide 20 mg daily, losartan 25 mg daily   Dietary habits include: compliant with salt restriction; denies drinking excess caffeine  Exercise habits include: limited  Family / Social history:  - FHx: no pertinent positives  - Tobacco: never smoker  - Alcohol: denies use   O:  Vitals:   06/29/20 1349  BP: (!) 182/68  Pulse: 87    Home BP readings: none  Last 3 Office BP readings: BP Readings from Last 3 Encounters:  06/29/20 (!) 182/68  06/15/20 (!) 182/118  05/31/20 (!) 169/92    BMET    Component Value Date/Time   NA 146 (H) 05/31/2020 1201   K 4.3 05/31/2020 1201   CL 104 05/31/2020 1201   CO2 27 05/31/2020 1201   GLUCOSE 126 (H) 05/31/2020 1201   GLUCOSE 72 04/25/2020 0320   BUN 13 05/31/2020 1201   CREATININE 1.00 05/31/2020 1201   CALCIUM 9.9 05/31/2020 1201   GFRNONAA 51 (L) 04/25/2020 0320    Renal function: CrCl cannot be calculated (Patient's most recent lab result is older than the maximum 21 days allowed.).  Clinical ASCVD: No  The ASCVD Risk score Denman George DC Jr., et al., 2013) failed to calculate for the following reasons:   The smoking status is invalid   A/P: Hypertension longstanding currently uncontrolled on current medications. BP Goal = < 130/80 mmHg. Medication adherence reported. Recommend increase in losartan. -Inc losartan dose to 50 mg daily. -Continue carvedilol 3.125 mg BID, furosemide for LE edema -F/u labs ordered - will plan on BMP at 1 month follow-up. -Counseled on lifestyle  modifications for blood pressure control including reduced dietary sodium, increased exercise, adequate sleep.  Results reviewed and written information provided.   Total time in face-to-face counseling 30 minutes.  F/U Clinic Visit in 4 weeks.   Butch Penny, PharmD, Patsy [place], CPP Clinical Pharmacist [facility] & [facility] [phone]

## 2020-07-02 ENCOUNTER — Other Ambulatory Visit: Payer: Self-pay | Admitting: Internal Medicine

## 2020-07-02 ENCOUNTER — Other Ambulatory Visit: Payer: Self-pay

## 2020-07-02 MED FILL — Glucose Blood Test Strip: 16 days supply | Qty: 100 | Fill #0 | Status: AC

## 2020-07-03 ENCOUNTER — Other Ambulatory Visit: Payer: Self-pay

## 2020-07-05 ENCOUNTER — Other Ambulatory Visit: Payer: Self-pay

## 2020-07-17 MED FILL — Glucose Blood Test Strip: 16 days supply | Qty: 100 | Fill #1 | Status: CN

## 2020-07-18 ENCOUNTER — Other Ambulatory Visit: Payer: Self-pay

## 2020-07-18 NOTE — Progress Notes (Signed)
Cardiology Office Note:    Date:  07/19/2020   ID:  Chelsea Norton, DOB 04/09/1950, MRN [id number]  PCP:  Chelsea Pier, MD   [place]  Cardiologist:  Chelsea Lean, MD  Advanced Practice Provider:  No care team member to display Electrophysiologist:  None       CC: Not sure Consulted for the evaluation of HFpEF at the behest of Chelsea Pier, MD  History of Present Illness:    Chelsea Norton is a 70 y.o. 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  Patient notes that she is feeling OK.  Notes some DOE with climbing stairs.  Notes that since having COVID-19 with DVT and still hasn't gotten her strength back.  Has had no chest pain, chest pressure, chest tightness, chest stinging.  Discomfort occurs when her blood pressure is elevated she feels neck tightness.  Patient exertion notable for going for walks and feels no symptoms (no neck pain or shortness of breath).  No shortness of breath.  No PND or orthopnea.  No bendopnea, weight gain, only leg swelling in R leg, or abdominal swelling.  No syncope or near syncope . Notes  no palpitations or funny heart beats.     Ambulatory BP at home were erroneous with her HTN specialist..   Past Medical History:  Diagnosis Date  . Asthma   . Hypertension     No past surgical history on file.  Current Medications: Current Meds  Medication Sig  . albuterol (VENTOLIN HFA) 108 (90 Base) MCG/ACT inhaler Inhale 1-2 puffs into the lungs every 6 (six) hours as needed for wheezing or shortness of breath.  Marland Kitchen apixaban (ELIQUIS) 5 MG TABS tablet Take 1 tablet (5 mg total) by mouth 2 (two) times daily.  . Ascorbic Acid (VITAMIN C) 1000 MG tablet Take 1,000 mg by mouth daily.  Marland Kitchen atorvastatin (LIPITOR) 40 MG tablet Take 1 tablet (40 mg total) by mouth daily.  Marland Kitchen b complex vitamins capsule Take 1  capsule by mouth daily.  . blood glucose meter kit and supplies KIT Dispense based on patient and insurance preference. Use up to four times daily as directed. (FOR ICD-9 250.00, 250.01).  . Blood Glucose Monitoring Suppl (TRUE METRIX METER) w/Device KIT USE AS DIRECTED  . carvedilol (COREG) 3.125 MG tablet Take 1 tablet (3.125 mg total) by mouth 2 (two) times daily with a meal.  . diclofenac Sodium (VOLTAREN) 1 % GEL Apply 2 g topically 4 (four) times daily.  . furosemide (LASIX) 20 MG tablet Take 1 tablet (20 mg total) by mouth daily.  Marland Kitchen glucose blood (TRUE METRIX BLOOD GLUCOSE TEST) test strip Use as instructed  . insulin aspart (NOVOLOG) 100 UNIT/ML FlexPen Inject 4 Units into the skin 3 (three) times daily with meals.  . insulin detemir (LEVEMIR) 100 UNIT/ML FlexPen Inject 10 Units into the skin daily.  . Insulin Pen Needle (PEN NEEDLES 3/16") 31G X 5 MM MISC 1 Device by Does not apply route QID.  Marland Kitchen Insulin Pen Needle 32G X 4 MM MISC USE AS DIRECTED 4 TIMES DAILY (Patient taking differently: 4 (four) times daily. use as directed)  . losartan (COZAAR) 100 MG tablet Take 1 tablet (100 mg total) by mouth daily.  . Omega-3 Fatty Acids (FISH OIL) 1000 MG CAPS Take 1,000 mg by mouth daily.  . TRUEplus Lancets 28G MISC  Use as directed  . Turmeric 500 MG CAPS Take 500 mg by mouth daily.  . Vitamin D, Cholecalciferol, 25 MCG (1000 UT) TABS Take 1,000 Units by mouth daily.  . Zinc 50 MG CAPS Take 50 mg by mouth daily.  . [DISCONTINUED] carvedilol (COREG) 3.125 MG tablet TAKE 1 TABLET (3.125 MG TOTAL) BY MOUTH 2 (TWO) TIMES DAILY WITH A MEAL.  . [DISCONTINUED] Coenzyme Q10 (COQ10) 30 MG CAPS Take 30 mg by mouth daily.  . [DISCONTINUED] dexamethasone (DECADRON) 6 MG tablet TAKE 1 TABLET (6 MG TOTAL) BY MOUTH DAILY FOR 4 DAYS.  . [DISCONTINUED] ferrous sulfate 325 (65 FE) MG tablet Take 325 mg by mouth daily with breakfast.  . [DISCONTINUED] losartan (COZAAR) 25 MG tablet TAKE 1 TABLET (25 MG TOTAL) BY  MOUTH DAILY.  . [DISCONTINUED] losartan (COZAAR) 50 MG tablet Take 1 tablet (50 mg total) by mouth daily.  . [DISCONTINUED] OVER THE COUNTER MEDICATION Take 400 mg by mouth daily. echinacea  . [DISCONTINUED] vitamin k 100 MCG tablet Take 100 mcg by mouth daily.     Allergies:   Patient has no known allergies.   Social History   Socioeconomic History  . Marital status: Single    Spouse name: Not on file  . Number of children: Not on file  . Years of education: Not on file  . Highest education level: Not on file  Occupational History  . Not on file  Tobacco Use  . Smoking status: Never Smoker  . Smokeless tobacco: Never Used  Substance and Sexual Activity  . Alcohol use: Never  . Drug use: Never  . Sexual activity: Not Currently  Other Topics Concern  . Not on file  Social History Narrative  . Not on file   Social Determinants of Health   Financial Resource Strain: Not on file  Food Insecurity: Not on file  Transportation Needs: Not on file  Physical Activity: Not on file  Stress: Not on file  Social Connections: Not on file     Family History: History of coronary artery disease notable for no members. History of heart failure notable for no members. History of arrhythmia notable for no members.  ROS:   Please see the history of present illness.     All other systems reviewed and are negative.  EKGs/Labs/Other Studies Reviewed:    The following studies were reviewed today:  EKG:   05/31/20: SR 95 LVH  Transthoracic Echocardiogram: Date: 04/19/20 Results: Mild thoracic Aortic dilation 1. Left ventricular ejection fraction, by estimation, is 55 to 60%. The  left ventricle has normal function. The left ventricle has no regional  wall motion abnormalities. There is moderate concentric left ventricular  hypertrophy. Left ventricular  diastolic parameters are consistent with Grade II diastolic dysfunction  (pseudonormalization). Elevated left ventricular  end-diastolic pressure.  2. Right ventricular systolic function is normal. The right ventricular  size is normal. There is mildly elevated pulmonary artery systolic  pressure.  3. Left atrial size was mildly dilated.  4. Right atrial size was mildly dilated.  5. The mitral valve is normal in structure. Trivial mitral valve  regurgitation. No evidence of mitral stenosis.  6. The aortic valve is tricuspid. There is mild calcification of the  aortic valve. Aortic valve regurgitation is not visualized. Mild aortic  valve sclerosis is present, with no evidence of aortic valve stenosis.  7. Aortic dilatation noted. There is mild to moderate dilatation of the  ascending aorta, measuring 42 mm.  8. The  inferior vena cava is dilated in size with >50% respiratory  variability, suggesting right atrial pressure of 8 mmHg.    Recent Labs: 04/25/2020: ALT 22; Magnesium 2.3 05/31/2020: BNP 17.7; BUN 13; Creatinine, Ser 1.00; Hemoglobin 12.2; Platelets 241; Potassium 4.3; Sodium 146; TSH 2.760  Recent Lipid Panel    Component Value Date/Time   CHOL 136 05/31/2020 1201   TRIG 92 05/31/2020 1201   HDL 47 05/31/2020 1201   CHOLHDL 2.9 05/31/2020 1201   LDLCALC 72 05/31/2020 1201     Risk Assessment/Calculations:     N/A  Physical Exam:    VS:  BP (!) 182/100   Pulse 67   Ht _0  (1.626 m)   Wt 247 lb (112 kg)   SpO2 96%   BMI 42.40 kg/m     Wt Readings from Last 3 Encounters:  07/19/20 247 lb (112 kg)  05/31/20 254 lb 3.2 oz (115.3 kg)  04/23/20 257 lb 4.4 oz (116.7 kg)   GEN: Obese well developed in no acute distress HEENT: Normal NECK: No JVD; No carotid bruits LYMPHATICS: No lymphadenopathy CARDIAC: RRR, no murmurs, rubs, gallops RESPIRATORY:  Clear to auscultation without rales, wheezing or rhonchi  ABDOMEN: Soft, non-tender, non-distended MUSCULOSKELETAL:  No edema; No deformity  SKIN: Warm and dry NEUROLOGIC:  Alert and oriented x 3 PSYCHIATRIC:  Normal affect    ASSESSMENT:    1. Thoracic aortic aneurysm without rupture ([place])   2. OSA (obstructive sleep apnea)   3. Chronic heart failure with preserved ejection fraction ([place])   4. Morbid obesity ([place])   5. 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, Morbid Obesity HTN with DM HLD OHS  OSA on CPAP - asymptomatic with a variety of medical problems - will start with increase in losartan 100 mg PO Daily; continue to see her Wellness Pharm D continue Coreg 3.125 mg PO BID - will get CT Aorta to define anatomy and timing of BP and cholesterol management; if further neck pain, especially with controlled BP will do NM Stress Test (discussed with patient)  Summer follow up unless new symptoms or abnormal test results warranting change in plan  Would be reasonable for  APP Follow up        Medication Adjustments/Labs and Tests Ordered: Current medicines are reviewed at length with the patient today.  Concerns regarding medicines are outlined above.  Orders Placed This Encounter  Procedures  . CT ANGIO CHEST AORTA W/CM & OR WO/CM  . Basic metabolic panel   Meds ordered this encounter  Medications  . losartan (COZAAR) 100 MG tablet    Sig: Take 1 tablet (100 mg total) by mouth daily.    Dispense:  90 tablet    Refill:  3    Patient Instructions  Medication Instructions:  Your physician has recommended you make the following change in your medication:  INCREASE: Losartan to 100 mg by mouth daily *If you need a refill on your cardiac medications before your next appointment, please call your pharmacy*   Lab Work: 1 WEEK: BMP If you have labs (blood work) drawn today and your tests are completely normal, you will receive your results only by: Marland Kitchen MyChart Message (if you have MyChart) OR . A paper copy in the mail If  you have any lab test that is abnormal or  we need to change your treatment, we will call you to review the results.   Testing/Procedures: Your physician has requested that you have a CT Aorta.   Follow-Up: At [facility], you and your health needs are our priority.  As part of our continuing mission to provide you with exceptional heart care, we have created designated Provider Care Teams.  These Care Teams include your primary Cardiologist (physician) and Advanced Practice Providers (APPs -  Physician Assistants and Nurse Practitioners) who all work together to provide you with the care you need, when you need it.  We recommend signing up for the patient portal called "MyChart".  Sign up information is provided on this After Visit Summary.  MyChart is used to connect with patients for Virtual Visits (Telemedicine).  Patients are able to view lab/test results, encounter notes, upcoming appointments, etc.  Non-urgent messages can be sent to your provider as well.   To learn more about what you can do with MyChart, go to [email or web address].    Your next appointment:   3 month(s)  The format for your next appointment:   In Person  Provider:   You may see Rudean Haskell, MD or one of the following Advanced Practice Providers on your designated Care Team:           Signed, Chelsea Lean, MD  07/19/2020 5:40 PM    [place]

## 2020-07-19 ENCOUNTER — Other Ambulatory Visit: Payer: Self-pay

## 2020-07-19 ENCOUNTER — Encounter: Payer: Self-pay | Admitting: Internal Medicine

## 2020-07-19 ENCOUNTER — Ambulatory Visit (INDEPENDENT_AMBULATORY_CARE_PROVIDER_SITE_OTHER): Payer: Self-pay | Admitting: Internal Medicine

## 2020-07-19 ENCOUNTER — Other Ambulatory Visit (HOSPITAL_COMMUNITY): Payer: Self-pay

## 2020-07-19 VITALS — BP 182/100 | HR 67 | Ht 64.0 in | Wt 247.0 lb

## 2020-07-19 DIAGNOSIS — E1169 Type 2 diabetes mellitus with other specified complication: Secondary | ICD-10-CM

## 2020-07-19 DIAGNOSIS — I5032 Chronic diastolic (congestive) heart failure: Secondary | ICD-10-CM

## 2020-07-19 DIAGNOSIS — I712 Thoracic aortic aneurysm, without rupture, unspecified: Secondary | ICD-10-CM | POA: Insufficient documentation

## 2020-07-19 DIAGNOSIS — E669 Obesity, unspecified: Secondary | ICD-10-CM

## 2020-07-19 DIAGNOSIS — I152 Hypertension secondary to endocrine disorders: Secondary | ICD-10-CM

## 2020-07-19 DIAGNOSIS — E1159 Type 2 diabetes mellitus with other circulatory complications: Secondary | ICD-10-CM

## 2020-07-19 DIAGNOSIS — G4733 Obstructive sleep apnea (adult) (pediatric): Secondary | ICD-10-CM

## 2020-07-19 MED ORDER — LOSARTAN POTASSIUM 100 MG PO TABS
100.0000 mg | ORAL_TABLET | Freq: Every day | ORAL | 3 refills | Status: DC
Start: 2020-07-19 — End: 2020-07-30
  Filled 2020-07-19: qty 90, 90d supply, fill #0

## 2020-07-19 NOTE — Patient Instructions (Addendum)
Medication Instructions:  Your physician has recommended you make the following change in your medication:  INCREASE: Losartan to 100 mg by mouth daily *If you need a refill on your cardiac medications before your next appointment, please call your pharmacy*   Lab Work: 1 WEEK: BMP If you have labs (blood work) drawn today and your tests are completely normal, you will receive your results only by: Marland Kitchen MyChart Message (if you have MyChart) OR . A paper copy in the mail If you have any lab test that is abnormal or we need to change your treatment, we will call you to review the results.   Testing/Procedures: Your physician has requested that you have a CT Aorta.   Follow-Up: At [facility], you and your health needs are our priority.  As part of our continuing mission to provide you with exceptional heart care, we have created designated Provider Care Teams.  These Care Teams include your primary Cardiologist (physician) and Advanced Practice Providers (APPs -  Physician Assistants and Nurse Practitioners) who all work together to provide you with the care you need, when you need it.  We recommend signing up for the patient portal called "MyChart".  Sign up information is provided on this After Visit Summary.  MyChart is used to connect with patients for Virtual Visits (Telemedicine).  Patients are able to view lab/test results, encounter notes, upcoming appointments, etc.  Non-urgent messages can be sent to your provider as well.   To learn more about what you can do with MyChart, go to [email or web address].    Your next appointment:   3 month(s)  The format for your next appointment:   In Person  Provider:   You may see Riley Lam, MD or one of the following Advanced Practice Providers on your designated Care Team:

## 2020-07-20 ENCOUNTER — Other Ambulatory Visit: Payer: Self-pay | Admitting: Pharmacy Technician

## 2020-07-20 ENCOUNTER — Other Ambulatory Visit: Payer: Self-pay

## 2020-07-25 ENCOUNTER — Other Ambulatory Visit: Payer: Self-pay

## 2020-07-25 ENCOUNTER — Ambulatory Visit (HOSPITAL_COMMUNITY)
Admission: RE | Admit: 2020-07-25 | Discharge: 2020-07-25 | Disposition: A | Discharge status: Home / Self Care | Payer: Self-pay | Source: Ambulatory Visit | Attending: Internal Medicine | Admitting: Internal Medicine

## 2020-07-25 DIAGNOSIS — I82402 Acute embolism and thrombosis of unspecified deep veins of left lower extremity: Secondary | ICD-10-CM | POA: Insufficient documentation

## 2020-07-25 NOTE — Progress Notes (Signed)
Lower extremity venous has been completed.   Preliminary results in CV Proc.   Blanch Media 07/25/2020 10:17 AM

## 2020-07-26 ENCOUNTER — Telehealth: Payer: Self-pay

## 2020-07-26 ENCOUNTER — Other Ambulatory Visit: Payer: Self-pay

## 2020-07-26 DIAGNOSIS — I712 Thoracic aortic aneurysm, without rupture, unspecified: Secondary | ICD-10-CM

## 2020-07-26 NOTE — Telephone Encounter (Signed)
Contacted pt to go over lab results pt is aware and doesn't have any questions or concerns 

## 2020-07-27 LAB — BASIC METABOLIC PANEL
BUN/Creatinine Ratio: 14 (ref 12–28)
BUN: 14 mg/dL (ref 8–27)
CO2: 26 mmol/L (ref 20–29)
Calcium: 9.7 mg/dL (ref 8.7–10.3)
Chloride: 104 mmol/L (ref 96–106)
Creatinine, Ser: 0.98 mg/dL (ref 0.57–1.00)
Glucose: 139 mg/dL — ABNORMAL HIGH (ref 65–99)
Potassium: 4.1 mmol/L (ref 3.5–5.2)
Sodium: 144 mmol/L (ref 134–144)
eGFR: 62 mL/min/{1.73_m2} (ref >59)

## 2020-07-30 ENCOUNTER — Other Ambulatory Visit: Payer: Self-pay | Admitting: Internal Medicine

## 2020-07-30 ENCOUNTER — Other Ambulatory Visit: Payer: Self-pay

## 2020-07-30 ENCOUNTER — Ambulatory Visit: Payer: Self-pay | Attending: Internal Medicine | Admitting: Pharmacist

## 2020-07-30 VITALS — BP 178/92

## 2020-07-30 DIAGNOSIS — I10 Essential (primary) hypertension: Secondary | ICD-10-CM

## 2020-07-30 MED ORDER — LOSARTAN POTASSIUM-HCTZ 100-25 MG PO TABS
1.0000 | ORAL_TABLET | Freq: Every day | ORAL | 2 refills | Status: DC
  Filled 2020-07-30: qty 30, 30d supply, fill #0

## 2020-07-30 NOTE — Telephone Encounter (Signed)
Notes to clinic:  Patient has appt today  Review for refills  Requested Prescriptions  Pending Prescriptions Disp Refills   losartan (COZAAR) 50 MG tablet 30 tablet 2    Sig: TAKE 1 TABLET (50 MG TOTAL) BY MOUTH DAILY.      Cardiovascular:  Angiotensin Receptor Blockers Failed - 07/30/2020 11:02 AM      Failed - Last BP in normal range    BP Readings from Last 1 Encounters:  07/19/20 (!) 182/100          Passed - Cr in normal range and within 180 days    Creatinine, Ser  Date Value Ref Range Status  07/26/2020 0.98 0.57 - 1.00 mg/dL Final          Passed - K in normal range and within 180 days    Potassium  Date Value Ref Range Status  07/26/2020 4.1 3.5 - 5.2 mmol/L Final          Passed - Patient is not pregnant      Passed - Valid encounter within last 6 months    Recent Outpatient Visits           1 month ago Essential hypertension   [place] [facility] Lois Huxley, Cornelius Moras, RPH-CPP   1 month ago Essential hypertension   [place] [facility] [place], Cornelius Moras, RPH-CPP   2 months ago Type 2 diabetes mellitus with morbid obesity [facility])   [place] [facility] Marcine Matar, MD   3 months ago Hospital discharge follow-up   [facility] Marcine Matar, MD       Future Appointments             Today Lois Huxley, Cornelius Moras, RPH-CPP [place] [facility]   Tomorrow Marcine Matar, MD [facility]   In 2 months [place], Rondel Jumbo, MD [facility], [facility]               carvedilol (COREG) 3.125 MG tablet 60 tablet 6    Sig: TAKE 1 TABLET (3.125 MG TOTAL) BY MOUTH 2 (TWO) TIMES DAILY WITH A MEAL.      Cardiovascular:  Beta Blockers Failed - 07/30/2020 11:02 AM      Failed - Last BP in normal range    BP Readings from Last 1 Encounters:  07/19/20 (!) 182/100           Passed - Last Heart Rate in normal range    Pulse Readings from Last 1 Encounters:  07/19/20 67          Passed - Valid encounter within last 6 months    Recent Outpatient Visits           1 month ago Essential hypertension   [place] [facility] Lois Huxley, Cornelius Moras, RPH-CPP   1 month ago Essential hypertension   [place] [facility] Drucilla Chalet, RPH-CPP   2 months ago Type 2 diabetes mellitus with morbid obesity [facility])   [place] [facility] Marcine Matar, MD   3 months ago Hospital discharge follow-up   [facility] Marcine Matar, MD       Future Appointments             Today Lois Huxley, Cornelius Moras, RPH-CPP [place] [facility]   Tomorrow Laural BenesJohnson, Binnie Raileborah B, MD [facility]   In 2 months [place]rase[place], Rondel JumboMahesh A, MD [facility] [address] [facility], [facility]               furosemide (LASIX) 20 MG tablet 30 tablet 5    Sig: TAKE 1 TABLET (20 MG TOTAL) BY MOUTH DAILY.      Cardiovascular:  Diuretics - Loop Failed - 07/30/2020 11:02 AM      Failed - Last BP in normal range    BP Readings from Last 1 Encounters:  07/19/20 (!) 182/100          Passed - K in normal range and within 360 days    Potassium  Date Value Ref Range Status  07/26/2020 4.1 3.5 - 5.2 mmol/L Final          Passed - Ca in normal range and within 360 days    Calcium  Date Value Ref Range Status  07/26/2020 9.7 8.7 - 10.3 mg/dL Final   Calcium, Ion  Date Value Ref Range Status  04/19/2020 1.24 1.15 - 1.40 mmol/L Final          Passed - Na in normal range and within 360 days    Sodium  Date Value Ref Range Status  07/26/2020 144 134 - 144 mmol/L Final          Passed - Cr in normal range and within 360 days    Creatinine, Ser  Date Value Ref Range Status  07/26/2020 0.98 0.57 - 1.00 mg/dL Final          Passed -  Valid encounter within last 6 months    Recent Outpatient Visits           1 month ago Essential hypertension   [place] [facility] Lois HuxleyVan Ausdall, Cornelius MorasStephen L, RPH-CPP   1 month ago Essential hypertension   [place] [facility] [place], Cornelius MorasStephen L, RPH-CPP   2 months ago Type 2 diabetes mellitus with morbid obesity [facility])   [place] [facility] Marcine MatarJohnson, Deborah B, MD   3 months ago Hospital discharge follow-up   [facility] Marcine MatarJohnson, Deborah B, MD       Future Appointments             Today Lois HuxleyVan Ausdall, Cornelius MorasStephen L, RPH-CPP  [facility]   Tomorrow Marcine MatarJohnson, Deborah B, MD [facility]   In 2 months [place]rase[place]har, Rondel JumboMahesh A, MD [facility] [address] [facility], [facility]               atorvastatin (LIPITOR) 40 MG tablet 30 tablet 6    Sig: TAKE 1 TABLET (40 MG TOTAL) BY MOUTH DAILY.      Cardiovascular:  Antilipid - Statins Passed - 07/30/2020 11:02 AM      Passed - Total Cholesterol in normal range and within 360 days    Cholesterol, Total  Date Value Ref Range Status  05/31/2020 136 100 - 199 mg/dL Final          Passed - LDL in normal range and within 360 days    LDL Chol Calc (NIH)  Date Value Ref Range Status  05/31/2020 72 0 - 99 mg/dL Final          Passed - HDL in normal range and within 360 days    HDL  Date  Value Ref Range Status  05/31/2020 47 >39 mg/dL Final          Passed - Triglycerides in normal range and within 360 days    Triglycerides  Date Value Ref Range Status  05/31/2020 92 0 - 149 mg/dL Final          Passed - Patient is not pregnant      Passed - Valid encounter within last 12 months    Recent Outpatient Visits           1 month ago Essential hypertension   [place] [facility] Lois Huxley, Cornelius Moras, RPH-CPP   1 month ago Essential hypertension   [place]  [facility] [place], Cornelius Moras, RPH-CPP   2 months ago Type 2 diabetes mellitus with morbid obesity (HCC)   [place] [facility] Marcine Matar, MD   3 months ago Hospital discharge follow-up   [facility] Marcine Matar, MD       Future Appointments             Today Lois Huxley, Cornelius Moras, RPH-CPP [place] [facility]   Tomorrow Marcine Matar, MD [facility]   In 2 months [place], Rondel Jumbo, MD [facility] Office, [facility]               TRUEplus Lancets 28G MISC 100 each 4    Sig: USE AS DIRECTED      Endocrinology: Diabetes - Testing Supplies Passed - 07/30/2020 11:02 AM      Passed - Valid encounter within last 12 months    Recent Outpatient Visits           1 month ago Essential hypertension   [place] [facility] Lois Huxley, Cornelius Moras, RPH-CPP   1 month ago Essential hypertension   [place] [facility] [place], Jeannett Senior L, RPH-CPP   2 months ago Type 2 diabetes mellitus with morbid obesity [facility])   [place] [facility] Marcine Matar, MD   3 months ago Hospital discharge follow-up   [facility] Marcine Matar, MD       Future Appointments             Today Lois Huxley, Cornelius Moras, RPH-CPP [place] [facility]   Tomorrow Marcine Matar, MD [facility]   In 2 months [place], Rondel Jumbo, MD [facility] Office, [facility]               apixaban (ELIQUIS) 5 MG TABS tablet 60 tablet 1    Sig: TAKE 1 TABLET (5 MG TOTAL) BY MOUTH 2 (TWO) TIMES DAILY.      Hematology:  Anticoagulants Passed - 07/30/2020 11:02 AM      Passed - HGB in normal range and within 360 days    Hemoglobin  Date Value Ref Range Status  05/31/2020 12.2 11.1 -  15.9 g/dL Final          Passed - PLT in normal range and within 360 days    Platelets  Date Value Ref Range Status  05/31/2020 241 150 - 450 x10E3/uL Final          Passed - HCT in normal range and within 360 days    Hematocrit  Date Value Ref  Range Status  05/31/2020 40.9 34.0 - 46.6 % Final          Passed - Cr in normal range and within 360 days    Creatinine, Ser  Date Value Ref Range Status  07/26/2020 0.98 0.57 - 1.00 mg/dL Final          Passed - Valid encounter within last 12 months    Recent Outpatient Visits           1 month ago Essential hypertension   [place] [facility] Lois Huxley, Cornelius Moras, RPH-CPP   1 month ago Essential hypertension   [place] [facility] Drucilla Chalet, RPH-CPP   2 months ago Type 2 diabetes mellitus with morbid obesity [facility])   [place] [facility] Marcine Matar, MD   3 months ago Hospital discharge follow-up   [facility] Marcine Matar, MD       Future Appointments             Today Lois Huxley, Cornelius Moras, RPH-CPP [facility]   Tomorrow Marcine Matar, MD [facility]   In 2 months [place], Rondel Jumbo, MD [facility] [address] Office, [address]

## 2020-07-30 NOTE — Progress Notes (Signed)
   S:    PCP: Dr. Laural Benes  Patient arrives in good spirits. Presents to the clinic for hypertension evaluation, counseling, and management. Patient was referred and last seen by Primary Care Provider on 05/31/2020. I saw her on 06/29/2020 and increased her losartan dose. Since then, she saw her Cardiologist who increased the losartan to 100 mg daily.   Medication adherence reported, however, she tells me today that she has not picked up the losartan 100 mg dose yet. She had a BMP at her Cardiology visit and her renal function was WNL. Electrolytes were WNL and stable.   Patient denies chest pain, dyspnea, HA, blurred vision.   Current BP Medications include:  Carvedilol 3.125 mg BID, furosemide 20 mg daily, losartan 100 mg daily (continues to take 50mg )  Dietary habits include: compliant with salt restriction; denies drinking excess caffeine  Exercise habits include: limited  Family / Social history:  - FHx: no pertinent positives  - Tobacco: never smoker  - Alcohol: denies use   O:  Vitals:   07/30/20 1418  BP: (!) 178/92   Home BP readings: none  Last 3 Office BP readings: BP Readings from Last 3 Encounters:  07/30/20 (!) 178/92  07/19/20 (!) 182/100  06/29/20 (!) 182/68    BMET    Component Value Date/Time   NA 144 07/26/2020 1342   K 4.1 07/26/2020 1342   CL 104 07/26/2020 1342   CO2 26 07/26/2020 1342   GLUCOSE 139 (H) 07/26/2020 1342   GLUCOSE 72 04/25/2020 0320   BUN 14 07/26/2020 1342   CREATININE 0.98 07/26/2020 1342   CALCIUM 9.7 07/26/2020 1342   GFRNONAA 51 (L) 04/25/2020 0320    Renal function: Estimated Creatinine Clearance: 66.4 mL/min (by C-G formula based on SCr of 0.98 mg/dL).  Clinical ASCVD: No  The 10-year ASCVD risk score 04/27/2020 DC Jr., et al., 2013) is: 32.6%   Values used to calculate the score:     Age: 70 years     Sex: Female     Is Non-Hispanic African American: Yes     Diabetic: Yes     Tobacco smoker: No     Systolic Blood Pressure:  178 mmHg     Is BP treated: Yes     HDL Cholesterol: 47 mg/dL     Total Cholesterol: 136 mg/dL   A/P: Hypertension longstanding currently uncontrolled on current medications. BP Goal = < 130/80 mmHg. Medication adherence reported. Will stop losartan and commence combination Hyzaar. Renal function and electrolytes from a couple of weeks ago were stable and wnl.  -Stop losartan.  -Start losartan-HCTZ 100-25mg  daily.  -Continue carvedilol 3.125 mg BID, furosemide for LE edema -F/u labs ordered - will plan on BMP at 1 month follow-up. -Counseled on lifestyle modifications for blood pressure control including reduced dietary sodium, increased exercise, adequate sleep.  Results reviewed and written information provided.   Total time in face-to-face counseling 30 minutes.  F/U Clinic Visit with Dr. 78.  Laural Benes, PharmD, Butch Penny, CPP Clinical Pharmacist [facility] & [facility] [phone]

## 2020-07-31 ENCOUNTER — Ambulatory Visit: Payer: Self-pay | Attending: Internal Medicine | Admitting: Internal Medicine

## 2020-07-31 ENCOUNTER — Encounter: Payer: Self-pay | Admitting: Internal Medicine

## 2020-07-31 ENCOUNTER — Other Ambulatory Visit: Payer: Self-pay

## 2020-07-31 DIAGNOSIS — E1169 Type 2 diabetes mellitus with other specified complication: Secondary | ICD-10-CM

## 2020-07-31 DIAGNOSIS — E1159 Type 2 diabetes mellitus with other circulatory complications: Secondary | ICD-10-CM

## 2020-07-31 DIAGNOSIS — Z1231 Encounter for screening mammogram for malignant neoplasm of breast: Secondary | ICD-10-CM

## 2020-07-31 DIAGNOSIS — I152 Hypertension secondary to endocrine disorders: Secondary | ICD-10-CM

## 2020-07-31 DIAGNOSIS — Z23 Encounter for immunization: Secondary | ICD-10-CM

## 2020-07-31 LAB — POCT GLYCOSYLATED HEMOGLOBIN (HGB A1C): HbA1c, POC (controlled diabetic range): 7.7 % — AB (ref 0.0–7.0)

## 2020-07-31 LAB — GLUCOSE, POCT (MANUAL RESULT ENTRY): POC Glucose: 126 mg/dL — AB (ref 70–99)

## 2020-07-31 NOTE — Progress Notes (Signed)
  Patient ID: Chelsea Norton, female    DOB: 07/13/1950  MRN: [id number]  CC: Diabetes and Hypertension   Subjective: Chelsea Norton is a 69 y.o. female who presents for chronic Her concerns today include:  Patient with history of Covid, DVT LLE associated with COVID, DM type II, HL,OHS, OSA, obesity, CHF with EF  55%, AKI.OA LT knee  DVT: Repeat Doppler ultrasound done since last visit revealed clots that resolved.  Told to discontinue Eliquis which she has done.  HYPERTENSION Currently taking: see medication list.  Since last visit she has seen the clinical pharmacist a few times.  She has also seen the cardiologist.  Started on Cozaar 50 mg.  This was subsequently discontinued yesterday and placed on Cozaar/hydrochlorothiazide instead.  She continues to take carvedilol and low-dose furosemide. Med Adherence: [x] Yes    [] No Medication side effects: [] Yes    [x] No  Adherence with salt restriction: [x] Yes    [] No Home Monitoring?: [] Yes    [x] No.  She brought her device with her when she saw the clinical pharmacist and it was determined that her machine was not giving accurate readings. Monitoring Frequency: [] Yes    [] No Home BP results range: [] Yes    [] No SOB? [] Yes    [x] No Chest Pain?: [] Yes    [x] No Leg swelling?: [] Yes    [x] No Headaches?: [] Yes    [x] No Dizziness? [] Yes    [x] No Comments:   DIABETES TYPE 2 Last A1C:   Results for orders placed or performed in visit on 07/31/20  POCT glucose (manual entry)  Result Value Ref Range   POC Glucose 126 (A) 70 - 99 mg/dl  POCT glycosylated hemoglobin (Hb A1C)  Result Value Ref Range   Hemoglobin A1C     HbA1c POC (<> result, manual entry)     HbA1c, POC (prediabetic range)     HbA1c, POC (controlled diabetic range) 7.7 (A) 0.0 - 7.0 %    Med Adherence:  [x] Yes.  On Levemir 10 units daily and NovoLog 4 units with meals. Medication side effects:  [] Yes    [x] No Home Monitoring?  [x] Yes   -checking 6 times a day - before meals and 2 hrs after meals.  She has log book with her. Home glucose results range:before meals 98-150, after meals 150-220 Diet Adherence: [x] Yes    [] No Exercise: [x] Yes -walks almost daily for 15 mins to the bus stop Hypoglycemic episodes?: [] Yes    [x] No Numbness of the feet? [] Yes    [x] No Retinopathy hx? [] Yes    [] No Last eye exam: Overdue for eye exam.  She is still waiting for Medicaid from [place] to be transferred to [place].  Referred for eye exam on last visit but no insurance. Comments:   HM:Reports having had pneumonia vaccines x 2 in [place].  Due for TDap.  Agreeable to receiving that today.  Due for mammogram.  Had colonoscopy 3 years ago in [place] where she had 1 polyp removed.  I still await her chart from previous PCP in [place].  She tells me that she did sign a release.   Patient Active Problem List   Diagnosis Date Noted  . Thoracic aortic aneurysm without rupture (HCC) 07/19/2020  . Chronic heart failure with preserved ejection fraction (HCC) 07/19/2020  . Morbid obesity (  [place]) 07/19/2020  . Essential hypertension 05/31/2020  . Primary osteoarthritis of left knee 05/31/2020  . Acute deep vein thrombosis (DVT) of left lower extremity ([place]) 05/01/2020  . Obesity, diabetes, and hypertension syndrome ([place]) 05/01/2020  . OSA (obstructive sleep apnea) 05/01/2020  . Respiratory failure ([place]) 04/19/2020     Current Outpatient Medications on File Prior to Visit  Medication Sig Dispense Refill  . albuterol (VENTOLIN HFA) 108 (90 Base) MCG/ACT inhaler Inhale 1-2 puffs into the lungs every 6 (six) hours as needed for wheezing or shortness of breath. 8 g 6  . apixaban (ELIQUIS) 5 MG TABS tablet Take 1 tablet (5 mg total) by mouth 2 (two) times daily. (Patient not taking: Reported on 07/31/2020) 60 tablet 1  . Ascorbic Acid (VITAMIN C) 1000 MG tablet Take 1,000 mg by mouth daily.    Marland Kitchen atorvastatin (LIPITOR)  40 MG tablet Take 1 tablet (40 mg total) by mouth daily. 30 tablet 6  . b complex vitamins capsule Take 1 capsule by mouth daily.    . blood glucose meter kit and supplies KIT Dispense based on patient and insurance preference. Use up to four times daily as directed. (FOR ICD-9 250.00, 250.01). 1 each 0  . Blood Glucose Monitoring Suppl (TRUE METRIX METER) w/Device KIT USE AS DIRECTED 1 kit 0  . carvedilol (COREG) 3.125 MG tablet Take 1 tablet (3.125 mg total) by mouth 2 (two) times daily with a meal. 60 tablet 6  . diclofenac Sodium (VOLTAREN) 1 % GEL Apply 2 g topically 4 (four) times daily. 100 g 2  . furosemide (LASIX) 20 MG tablet Take 1 tablet (20 mg total) by mouth daily. 30 tablet 5  . glucose blood (TRUE METRIX BLOOD GLUCOSE TEST) test strip Use as instructed 100 each 12  . insulin aspart (NOVOLOG) 100 UNIT/ML FlexPen Inject 4 Units into the skin 3 (three) times daily with meals. 15 mL 6  . insulin detemir (LEVEMIR) 100 UNIT/ML FlexPen Inject 10 Units into the skin daily. 15 mL 6  . Insulin Pen Needle (PEN NEEDLES 3/16") 31G X 5 MM MISC 1 Device by Does not apply route QID. 100 each 6  . Insulin Pen Needle 32G X 4 MM MISC USE AS DIRECTED 4 TIMES DAILY (Patient taking differently: 4 (four) times daily. use as directed) 100 each 0  . losartan-hydrochlorothiazide (HYZAAR) 100-25 MG tablet Take 1 tablet by mouth daily. 30 tablet 2  . Omega-3 Fatty Acids (FISH OIL) 1000 MG CAPS Take 1,000 mg by mouth daily.    . TRUEplus Lancets 28G MISC Use as directed 100 each 4  . Turmeric 500 MG CAPS Take 500 mg by mouth daily.    . Vitamin D, Cholecalciferol, 25 MCG (1000 UT) TABS Take 1,000 Units by mouth daily.    . Zinc 50 MG CAPS Take 50 mg by mouth daily.     No current facility-administered medications on file prior to visit.    No Known Allergies  Social History   Socioeconomic History  . Marital status: Single    Spouse name: Not on file  . Number of children: Not on file  . Years of  education: Not on file  . Highest education level: Not on file  Occupational History  . Not on file  Tobacco Use  . Smoking status: Never Smoker  . Smokeless tobacco: Never Used  Substance and Sexual Activity  . Alcohol use: Never  . Drug use: Never  . Sexual activity: Not Currently  Other  Topics Concern  . Not on file  Social History Narrative  . Not on file   Social Determinants of Health   Financial Resource Strain: Not on file  Food Insecurity: Not on file  Transportation Needs: Not on file  Physical Activity: Not on file  Stress: Not on file  Social Connections: Not on file  Intimate Partner Violence: Not on file    No family history on file.  No past surgical history on file.  ROS: Review of Systems Negative except as stated above  PHYSICAL EXAM: BP (!) 172/97   Pulse 93   Resp 16   Wt 248 lb 3.2 oz (112.6 kg)   SpO2 95%   BMI 42.60 kg/m   Physical Exam  General appearance - alert, well appearing, and in no distress Mental status - normal mood, behavior, speech, dress, motor activity, and thought processes Neck - supple, no significant adenopathy Chest - clear to auscultation, no wheezes, rales or rhonchi, symmetric air entry Heart - normal rate, regular rhythm, normal S1, S2, no murmurs, rubs, clicks or gallops Extremities -no lower extremity edema.  CMP Latest Ref Rng & Units 07/26/2020 05/31/2020 04/25/2020  Glucose 65 - 99 mg/dL 139(H) 126(H) 72  BUN 8 - 27 mg/dL 14 13 28(H)  Creatinine 0.57 - 1.00 mg/dL 0.98 1.00 1.17(H)  Sodium 134 - 144 mmol/L 144 146(H) 144  Potassium 3.5 - 5.2 mmol/L 4.1 4.3 3.5  Chloride 96 - 106 mmol/L 104 104 102  CO2 20 - 29 mmol/L 26 27 34(H)  Calcium 8.7 - 10.3 mg/dL 9.7 9.9 9.1  Total Protein 6.5 - 8.1 g/dL - - 5.9(L)  Total Bilirubin 0.3 - 1.2 mg/dL - - 0.6  Alkaline Phos 38 - 126 U/L - - 44  AST 15 - 41 U/L - - 19  ALT 0 - 44 U/L - - 22   Lipid Panel     Component Value Date/Time   CHOL 136 05/31/2020 1201    TRIG 92 05/31/2020 1201   HDL 47 05/31/2020 1201   CHOLHDL 2.9 05/31/2020 1201   LDLCALC 72 05/31/2020 1201    CBC    Component Value Date/Time   WBC 8.0 05/31/2020 1201   WBC 7.1 04/25/2020 0320   RBC 5.41 (H) 05/31/2020 1201   RBC 5.20 (H) 04/25/2020 0320   HGB 12.2 05/31/2020 1201   HCT 40.9 05/31/2020 1201   PLT 241 05/31/2020 1201   MCV 76 (L) 05/31/2020 1201   MCH 22.6 (L) 05/31/2020 1201   MCH 22.7 (L) 04/25/2020 0320   MCHC 29.8 (L) 05/31/2020 1201   MCHC 30.0 04/25/2020 0320   RDW 15.8 (H) 05/31/2020 1201   LYMPHSABS 1.8 04/25/2020 0320   MONOABS 0.7 04/25/2020 0320   EOSABS 0.2 04/25/2020 0320   BASOSABS 0.0 04/25/2020 0320    ASSESSMENT AND PLAN: 1. Type 2 diabetes mellitus with morbid obesity (HCC) A1c much improved. She will continue current dose of Levemir and NovoLog.  She will come to the lab in 1 week to have chemistry done.  If creatinine and GFR look okay, we will consider stopping the NovoLog and adding metformin (which she was on previously ) or another oral agent. -Advised of low-cost places to have eye exam done. - POCT glucose (manual entry) - POCT glycosylated hemoglobin (Hb A1C)  2. Hypertension associated with diabetes ([place]) Not at goal.  She was just started on the Cozaar/HCTZ yesterday so I will not make any changes today.  I will have her  follow-up with the clinical pharmacist in 1 week.  If blood pressure still not at goal, I would recommend increasing the carvedilol or adding amlodipine - Basic Metabolic Panel; Future  3. Encounter for screening mammogram for malignant neoplasm of breast Referred.  4. Need for Tdap vaccination Given     Patient was given the opportunity to ask questions.  Patient verbalized understanding of the plan and was able to repeat key elements of the plan.   Orders Placed This Encounter  Procedures  . POCT glucose (manual entry)  . POCT glycosylated hemoglobin (Hb A1C)     Requested Prescriptions     No prescriptions requested or ordered in this encounter    No follow-ups on file.  Karle Plumber, MD, FACP

## 2020-07-31 NOTE — Patient Instructions (Addendum)
Your blood pressure is still not at goal.  I did not make any changes to your medications today since we just started the losartan/hydrochlorothiazide yesterday.  We will have you follow-up with our clinical pharmacist Dr. Franky Macho again in 1 week.  If the blood pressure is still not at goal, we will plan to increase the carvedilol.  Please return to the lab in 1 week to have blood test done to check the kidney function and your potassium level given the new blood pressure medication that was started yesterday.  Based on the results of that, I will make a decision whether we can restart the metformin and have you stop the NovoLog.   Td (Tetanus, Diphtheria) Vaccine: What You Need to Know 1. Why get vaccinated? Td vaccine can prevent tetanus and diphtheria. Tetanus enters the body through cuts or wounds. Diphtheria spreads from person to person.  TETANUS (T) causes painful stiffening of the muscles. Tetanus can lead to serious health problems, including being unable to open the mouth, having trouble swallowing and breathing, or death.  DIPHTHERIA (D) can lead to difficulty breathing, heart failure, paralysis, or death. 2. Td vaccine Td is only for children 7 years and older, adolescents, and adults.  Td is usually given as a booster dose every 10 years, or after 5 years in the case of a severe or dirty wound or burn. Another vaccine, called "Tdap," may be used instead of Td. Tdap protects against pertussis, also known as "whooping cough," in addition to tetanus and diphtheria. Td may be given at the same time as other vaccines. 3. Talk with your health care provider Tell your vaccination provider if the person getting the vaccine:  Has had an allergic reaction after a previous dose of any vaccine that protects against tetanus or diphtheria, or has any severe, life-threatening allergies  Has ever had Guillain-Barr Syndrome (also called "GBS")  Has had severe pain or swelling after a previous dose  of any vaccine that protects against tetanus or diphtheria In some cases, your health care provider may decide to postpone Td vaccination until a future visit. People with minor illnesses, such as a cold, may be vaccinated. People who are moderately or severely ill should usually wait until they recover before getting Td vaccine.  Your health care provider can give you more information. 4. Risks of a vaccine reaction  Pain, redness, or swelling where the shot was given, mild fever, headache, feeling tired, and nausea, vomiting, diarrhea, or stomachache sometimes happen after Td vaccination. People sometimes faint after medical procedures, including vaccination. Tell your provider if you feel dizzy or have vision changes or ringing in the ears.  As with any medicine, there is a very remote chance of a vaccine causing a severe allergic reaction, other serious injury, or death. 5. What if there is a serious problem? An allergic reaction could occur after the vaccinated person leaves the clinic. If you see signs of a severe allergic reaction (hives, swelling of the face and throat, difficulty breathing, a fast heartbeat, dizziness, or weakness), call 9-1-1 and get the person to the nearest hospital.  For other signs that concern you, call your health care provider.  Adverse reactions should be reported to the Vaccine Adverse Event Reporting System (VAERS). Your health care provider will usually file this report, or you can do it yourself. Visit the VAERS website at www.vaers.LAgents.no or call 509-328-6525. VAERS is only for reporting reactions, and VAERS staff members do not give medical advice. 6.  The National Vaccine Injury Compensation Program The National Vaccine Injury Compensation Program (VICP) is a federal program that was created to compensate people who may have been injured by certain vaccines. Claims regarding alleged injury or death due to vaccination have a time limit for filing, which may be  as short as two years. Visit the VICP website at SpiritualWord.at or call 763-162-5062 to learn about the program and about filing a claim. 7. How can I learn more?  Ask your health care provider.  Call your local or state health department.  Visit the website of the Food and Drug Administration (FDA) for vaccine package inserts and additional information at FinderList.no.  Contact the Centers for Disease Control and Prevention (CDC): ? Call 272-239-6204 (1-800-CDC-INFO) or ? Visit CDC's website at PicCapture.uy. Vaccine Information Statement Td (Tetanus, Diphtheria) Vaccine (11/04/2019) This information is not intended to replace advice given to you by your health care provider. Make sure you discuss any questions you have with your health care provider. Document Revised: 12/22/2019 Document Reviewed: 12/22/2019 Elsevier Patient Education  2021 [employer].

## 2020-08-01 ENCOUNTER — Other Ambulatory Visit: Payer: Self-pay

## 2020-08-03 ENCOUNTER — Other Ambulatory Visit: Payer: Self-pay

## 2020-08-03 ENCOUNTER — Ambulatory Visit (INDEPENDENT_AMBULATORY_CARE_PROVIDER_SITE_OTHER)
Admission: RE | Admit: 2020-08-03 | Discharge: 2020-08-03 | Disposition: A | Discharge status: Home / Self Care | Payer: Self-pay | Source: Ambulatory Visit | Attending: Internal Medicine | Admitting: Internal Medicine

## 2020-08-03 DIAGNOSIS — I712 Thoracic aortic aneurysm, without rupture, unspecified: Secondary | ICD-10-CM

## 2020-08-03 MED ORDER — IOHEXOL 350 MG/ML SOLN
100.0000 mL | Freq: Once | INTRAVENOUS | Status: AC | PRN
  Administered 2020-08-03: 100 mL via INTRAVENOUS

## 2020-08-06 NOTE — Progress Notes (Signed)
S:    PCP: Dr. Laural Benes   Patient arrives in good spirits. Presents to the clinic for hypertension evaluation, counseling, and management.  Patient was referred and last seen by Primary Care Provider on 07/31/20. At that visit, her BP was 172/97 mmHg. No changes were made as pt had just started combination Hyzaar the day before. Of note, pt endorsed nocturia with Hyzaar initially. Dr. Laural Benes instructed pt to switch to qAM dosing.   Today, patient reports she does not take BP at home currently.  States she needs a bigger cuff.    Medication adherence - patient reports med adherence with no missed doses.Took meds at 8:40 am this morning. Of note, she endorses nausea and slight headaches after taking Hyzaar. Reports that this started when we switched her to Hyzaar.  Current BP Medications include: Hyzaar 100-25 mg daily, Carvedilol 3.125 mg BID, Furosemide 20 mg daily  Antihypertensives tried in the past include: Losartan 25 mg daily, Losartan 50 mg daily, and Losartan 100 mg daily   Dietary habits include: endorses diet consisting of  Pasta but tries to eat lots of veggies Exercise habits include: Patient endorses she walks 2-3 blocks daily when she gets her grandchildren off the bus but cant go too far due to knee pain.   Family / Social history: - FHx: None contributory  - Tobacco: denies use  - Alcohol: denies use   O:  Vitals:   08/07/20 1134  BP: 140/70  Pulse: 60   Home BP readings: none   Last 3 Office BP readings: BP Readings from Last 3 Encounters:  08/07/20 140/70  07/31/20 (!) 172/97  07/30/20 (!) 178/92   BMET    Component Value Date/Time   NA 144 07/26/2020 1342   K 4.1 07/26/2020 1342   CL 104 07/26/2020 1342   CO2 26 07/26/2020 1342   GLUCOSE 139 (H) 07/26/2020 1342   GLUCOSE 72 04/25/2020 0320   BUN 14 07/26/2020 1342   CREATININE 0.98 07/26/2020 1342   CALCIUM 9.7 07/26/2020 1342   GFRNONAA 51 (L) 04/25/2020 0320   Renal function: Estimated  Creatinine Clearance: 66.6 mL/min (by C-G formula based on SCr of 0.98 mg/dL).  Clinical ASCVD: No  The 10-year ASCVD risk score Denman George DC Jr., et al., 2013) is: 21.4%   Values used to calculate the score:     Age: 53 years     Sex: Female     Is Non-Hispanic African American: Yes     Diabetic: Yes     Tobacco smoker: No     Systolic Blood Pressure: 140 mmHg     Is BP treated: Yes     HDL Cholesterol: 47 mg/dL     Total Cholesterol: 136 mg/dL  A/P: Hypertension longstanding currently uncontrolled on current medications. BP Goal = <130/80 mmHg. Medication adherence reported. Renal function and electrolytes on 07/26/20 were WNL. Due to adverse effects, will discontinue Hyzaar.  - D/c Hyzaar  - Restart Losartan 100 mg daily.  - Initiate Amlodipine 5 mg daily.  - Continue other current blood pressure medications.   - F/u labs ordered - BMP at 1 month follow up. - Counseled on lifestyle modifications for blood pressure control including reduced dietary sodium of < 1500 mg/daily, increased exercise of 90 - 150 min/week, adequate sleep and implementing the DASH diet.  Results reviewed and written information provided.   Total time in face-to-face counseling 30 minutes.   F/U Clinic Visit in 2 weeks with [facility].  Patient seen with [place] Jaken Fregia, [facility] PharmD Candidate, 2023  Butch Penny, PharmD, [place], CPP Clinical Pharmacist [facility] & [facility] [phone]

## 2020-08-07 ENCOUNTER — Other Ambulatory Visit: Payer: Self-pay

## 2020-08-07 ENCOUNTER — Ambulatory Visit: Payer: Self-pay | Attending: Internal Medicine

## 2020-08-07 ENCOUNTER — Ambulatory Visit (HOSPITAL_BASED_OUTPATIENT_CLINIC_OR_DEPARTMENT_OTHER): Payer: Self-pay | Admitting: Pharmacist

## 2020-08-07 VITALS — BP 140/70 | HR 60

## 2020-08-07 DIAGNOSIS — I152 Hypertension secondary to endocrine disorders: Secondary | ICD-10-CM

## 2020-08-07 DIAGNOSIS — E1159 Type 2 diabetes mellitus with other circulatory complications: Secondary | ICD-10-CM

## 2020-08-07 MED ORDER — AMLODIPINE BESYLATE 5 MG PO TABS
5.0000 mg | ORAL_TABLET | Freq: Every day | ORAL | 0 refills | Status: DC
  Filled 2020-08-07: qty 30, 30d supply, fill #0
  Filled 2020-09-03 – 2020-09-10 (×2): qty 30, 30d supply, fill #1
  Filled 2020-11-01: qty 30, 30d supply, fill #2

## 2020-08-07 MED ORDER — LOSARTAN POTASSIUM 100 MG PO TABS
100.0000 mg | ORAL_TABLET | Freq: Every day | ORAL | 1 refills | Status: DC
  Filled 2020-08-07: qty 30, 30d supply, fill #0
  Filled 2020-09-03 – 2020-09-10 (×2): qty 30, 30d supply, fill #1
  Filled 2020-11-01: qty 30, 30d supply, fill #2

## 2020-08-07 NOTE — Patient Instructions (Signed)
Thank you for coming to see [name] today.   Blood pressure today is improving.   Stop the combination losartan-hydrochlorothiazide pill.   Start taking losartan 100 mg once a day.  Start taking amlodipine 5 mg once a day.  Continue taking carvedilol 3.125 mg once in the morning and once in the evening.  Limiting salt and caffeine, as well as exercising as able for at least 30 minutes for 5 days out of the week, can also help you lower your blood pressure.  Take your blood pressure at home if you are able. Please write down these numbers and bring them to your visits.  If you have any questions about medications, please call me [phone].  Chelsea Norton

## 2020-08-08 ENCOUNTER — Encounter: Payer: Self-pay | Admitting: Internal Medicine

## 2020-08-08 NOTE — Progress Notes (Signed)
I received patient's records from [facility] Last Pap: 08/20/2015 negative  Screening colonoscopy done 12/24/2015 one 5 mm rectal polyp removed.  Diverticulosis in sigmoid colon.  Nonbleeding internal and external hemorrhoids.  Pathology from the rectal polyp showed no specific pathologic changes.  Sleep study titration done 11/20/2016: Severe sleep apnea.  CPAP set to 11-12 cm of water.  Res-med [place] fx heated humidification.  Mammogram: 04/13/2017 was negative.  She had right breast biopsy done 06/20/2015 which revealed benign mammary tissue.  Echo done 06/30/2017 revealed EF of 55 to 59%.  Normal LA and VA size.  DEXA scan done 12/11/2016: T score at the lumbar spine -1.7, left femoral neck -0.8  MRI of the brain done 10/14/2016 revealed no acute abnormalities.  Few scattered hyperintensities in the supratentorial white matter  Diagnosed with gout of the right wrist 05/14/2018.  I have updated her immunization records with immunizations that were given.

## 2020-08-10 ENCOUNTER — Telehealth: Payer: Self-pay

## 2020-08-10 DIAGNOSIS — R911 Solitary pulmonary nodule: Secondary | ICD-10-CM

## 2020-08-10 NOTE — Telephone Encounter (Signed)
Spoke with patient daughter ok per DPR.  Informed her of results and MD recommendations.  Daughter made aware that referral will be placed and Pulmonary nodule clinic will call to schedule an appointment. Daughter verbalized understanding.

## 2020-08-10 NOTE — Telephone Encounter (Signed)
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

## 2020-08-13 ENCOUNTER — Encounter: Payer: Self-pay | Admitting: *Deleted

## 2020-08-13 NOTE — Progress Notes (Signed)
I received referral on Chelsea Norton.  I updated Dr. Tonia Brooms with pulmonary and he states he needs to be seen with them at this time not med onc.  I updated Dr. Izora Ribas of referral. Patient is set up with pulmonary team on 09/05/20.

## 2020-08-21 ENCOUNTER — Other Ambulatory Visit: Payer: Self-pay

## 2020-08-21 ENCOUNTER — Telehealth: Payer: Self-pay | Admitting: Internal Medicine

## 2020-08-21 ENCOUNTER — Ambulatory Visit: Payer: Self-pay | Attending: Internal Medicine | Admitting: Pharmacist

## 2020-08-21 ENCOUNTER — Encounter: Payer: Self-pay | Admitting: Pharmacist

## 2020-08-21 VITALS — BP 141/81 | HR 101

## 2020-08-21 DIAGNOSIS — I152 Hypertension secondary to endocrine disorders: Secondary | ICD-10-CM

## 2020-08-21 DIAGNOSIS — E1159 Type 2 diabetes mellitus with other circulatory complications: Secondary | ICD-10-CM

## 2020-08-21 NOTE — Telephone Encounter (Signed)
Pt was sent a letter from financial dept. Inform them, that the application they submitted was incomplete, since they were missing some documentation at the time of the appointment, Pt need to reschedule and resubmit all new papers and application for [employer] and [place], P.S. old documents has been sent back by mail to the Pt and Pt. need to make a new appt. 

## 2020-08-21 NOTE — Progress Notes (Signed)
   S:    PCP: Dr. Laural Benes   Patient arrives in good spirits. Presents to the clinic for hypertension evaluation, counseling, and management. Patient was referred and last seen by Primary Care Provider on 07/31/20. Last seen by clinical pharmacist on 5/10 - BP at this visit was 140/70. Hyzaar was discontinued due to nausea and HA. Losartan 100 mg and amlodipine 5 mg was started.    Today, she reports appropriate adherence - has taken medications this morning (~1 hour prior to appt). Denies side effects or concerns to the medications. She does not take BP at home currently.  States she needs a bigger cuff.    Current BP Medications include: Losartan 100 mg, Amlodipine 5 mg, Carvedilol 3.125 mg BID, Furosemide 20 mg daily  Antihypertensives tried in the past include: Hyzaar, Losartan 25 mg daily, Losartan 50 mg daily, and Losartan 100 mg daily   Dietary habits include: endorses diet consisting of pasta but tries to eat lots of veggies, occasionally drinks soda, drinks coffee, regular portions of salt  Exercise habits include: Patient endorses she walks 2-3 blocks daily when she gets her grandchildren off the bus but cant go too far due to knee pain.   Family / Social history: - FHx: None contributory  - Tobacco: denies use  - Alcohol: denies use   O:  Vitals:   08/21/20 0928  BP: (!) 141/81  Pulse: (!) 101   Home BP readings: none   Last 3 Office BP readings: BP Readings from Last 3 Encounters:  08/21/20 (!) 141/81  08/07/20 140/70  07/31/20 (!) 172/97   BMET    Component Value Date/Time   NA 144 07/26/2020 1342   K 4.1 07/26/2020 1342   CL 104 07/26/2020 1342   CO2 26 07/26/2020 1342   GLUCOSE 139 (H) 07/26/2020 1342   GLUCOSE 72 04/25/2020 0320   BUN 14 07/26/2020 1342   CREATININE 0.98 07/26/2020 1342   CALCIUM 9.7 07/26/2020 1342   GFRNONAA 51 (L) 04/25/2020 0320   Renal function: CrCl cannot be calculated (Patient's most recent lab result is older than the maximum 21  days allowed.).  Clinical ASCVD: No  The 10-year ASCVD risk score Denman George DC Jr., et al., 2013) is: 21.7%   Values used to calculate the score:     Age: 70 years     Sex: Female     Is Non-Hispanic African American: Yes     Diabetic: Yes     Tobacco smoker: No     Systolic Blood Pressure: 141 mmHg     Is BP treated: Yes     HDL Cholesterol: 47 mg/dL     Total Cholesterol: 136 mg/dL  A/P: Hypertension longstanding currently uncontrolled on current medications. BP Goal = <130/80 mmHg. Medication adherence reported - however, patient denies recommendation today to increase amlodipine to 10 mg. She will return in two weeks to recheck BP. - Continue current BP regimen.  - Consider increasing amlodipine at next visit if BP remains elevated  - Counseled on lifestyle modifications for blood pressure control including reduced dietary sodium of < 1500 mg/daily, increased exercise of 90 - 150 min/week, adequate sleep and implementing the DASH diet.  Results reviewed and written information provided.   Total time in face-to-face counseling 30 minutes.   F/U Clinic Visit in 2 weeks with [facility].    Theodis Sato, PharmD PGY-1 [facility] Pharmacy Resident   08/21/2020 10:10 AM

## 2020-08-28 ENCOUNTER — Other Ambulatory Visit: Payer: Self-pay

## 2020-09-03 ENCOUNTER — Other Ambulatory Visit: Payer: Self-pay

## 2020-09-05 ENCOUNTER — Institutional Professional Consult (permissible substitution): Payer: Self-pay | Admitting: Pulmonary Disease

## 2020-09-10 ENCOUNTER — Other Ambulatory Visit: Payer: Self-pay

## 2020-09-10 ENCOUNTER — Encounter: Payer: Self-pay | Admitting: Pharmacist

## 2020-09-10 ENCOUNTER — Ambulatory Visit: Payer: Self-pay | Attending: Internal Medicine | Admitting: Pharmacist

## 2020-09-10 ENCOUNTER — Other Ambulatory Visit: Payer: Self-pay | Admitting: Internal Medicine

## 2020-09-10 VITALS — BP 128/74

## 2020-09-10 DIAGNOSIS — M1712 Unilateral primary osteoarthritis, left knee: Secondary | ICD-10-CM

## 2020-09-10 DIAGNOSIS — I152 Hypertension secondary to endocrine disorders: Secondary | ICD-10-CM

## 2020-09-10 DIAGNOSIS — E1159 Type 2 diabetes mellitus with other circulatory complications: Secondary | ICD-10-CM

## 2020-09-10 MED ORDER — DICLOFENAC SODIUM 1 % EX GEL
2.0000 g | Freq: Four times a day (QID) | CUTANEOUS | 2 refills | Status: AC
  Filled 2020-09-10: qty 100, 20d supply, fill #0
  Filled 2020-09-28: qty 100, 20d supply, fill #1
  Filled 2020-11-01: qty 100, 20d supply, fill #2

## 2020-09-10 NOTE — Progress Notes (Signed)
   S:    PCP: Dr. Laural Benes   Patient arrives in good spirits. Presents to the clinic for hypertension evaluation, counseling, and management. Patient was referred and last seen by Primary Care Provider on 07/31/20. Last seen by clinical pharmacist on 08/21/2020 - BP at this visit was 141/81. We made no changes to patient's medications.    Today, she reports appropriate adherence - has taken medications this morning. Denies side effects or concerns to the medications.    Current BP Medications include: Losartan 100 mg, Amlodipine 5 mg, Carvedilol 3.125 mg BID, Furosemide 20 mg daily  Antihypertensives tried in the past include: Hyzaar, Losartan 25 mg daily, Losartan 50 mg daily, and Losartan 100 mg daily   Dietary habits include: since last visit, she has really worked to eliminate most salt from her diet. She consumes a lot of  veggies. Drinks coffee daily. Exercise habits include: Patient endorses she walks 2-3 blocks daily when she gets her grandchildren off the bus but cant go too far due to knee pain.   Family / Social history: - FHx: None contributory  - Tobacco: denies use  - Alcohol: denies use   O:  Vitals:   09/10/20 1513  BP: 128/74   Home BP readings: none   Last 3 Office BP readings: BP Readings from Last 3 Encounters:  09/10/20 128/74  08/21/20 (!) 141/81  08/07/20 140/70   BMET    Component Value Date/Time   NA 144 07/26/2020 1342   K 4.1 07/26/2020 1342   CL 104 07/26/2020 1342   CO2 26 07/26/2020 1342   GLUCOSE 139 (H) 07/26/2020 1342   GLUCOSE 72 04/25/2020 0320   BUN 14 07/26/2020 1342   CREATININE 0.98 07/26/2020 1342   CALCIUM 9.7 07/26/2020 1342   GFRNONAA 51 (L) 04/25/2020 0320   Renal function: CrCl cannot be calculated (Patient's most recent lab result is older than the maximum 21 days allowed.).  Clinical ASCVD: No  The 10-year ASCVD risk score Denman George DC Jr., et al., 2013) is: 18.2%   Values used to calculate the score:     Age: 70 years      Sex: Female     Is Non-Hispanic African American: Yes     Diabetic: Yes     Tobacco smoker: No     Systolic Blood Pressure: 128 mmHg     Is BP treated: Yes     HDL Cholesterol: 47 mg/dL     Total Cholesterol: 136 mg/dL  A/P: Hypertension longstanding currently at goal on current medications. BP Goal = <130/80 mmHg. Medication adherence reported.  - Continue current BP regimen.  - Consider increasing amlodipine at next visit if BP remains elevated  - Counseled on lifestyle modifications for blood pressure control including reduced dietary sodium of < 1500 mg/daily, increased exercise of 90 - 150 min/week, adequate sleep and implementing the DASH diet.  Results reviewed and written information provided.   Total time in face-to-face counseling 30 minutes.   F/U Clinic Visit in August with PCP.    Butch Penny, PharmD, P[place]y [place], CPP Clinical Pharmacist [facility] [phone]

## 2020-09-13 ENCOUNTER — Other Ambulatory Visit: Payer: Self-pay

## 2020-09-28 ENCOUNTER — Other Ambulatory Visit: Payer: Self-pay

## 2020-10-23 ENCOUNTER — Ambulatory Visit: Payer: Self-pay | Admitting: Internal Medicine

## 2020-11-01 ENCOUNTER — Ambulatory Visit: Payer: Self-pay | Attending: Internal Medicine | Admitting: Internal Medicine

## 2020-11-01 ENCOUNTER — Encounter: Payer: Self-pay | Admitting: Internal Medicine

## 2020-11-01 ENCOUNTER — Other Ambulatory Visit: Payer: Self-pay

## 2020-11-01 DIAGNOSIS — I5032 Chronic diastolic (congestive) heart failure: Secondary | ICD-10-CM

## 2020-11-01 DIAGNOSIS — E1159 Type 2 diabetes mellitus with other circulatory complications: Secondary | ICD-10-CM

## 2020-11-01 DIAGNOSIS — I152 Hypertension secondary to endocrine disorders: Secondary | ICD-10-CM

## 2020-11-01 DIAGNOSIS — E1169 Type 2 diabetes mellitus with other specified complication: Secondary | ICD-10-CM

## 2020-11-01 DIAGNOSIS — B009 Herpesviral infection, unspecified: Secondary | ICD-10-CM

## 2020-11-01 LAB — POCT GLYCOSYLATED HEMOGLOBIN (HGB A1C): HbA1c, POC (controlled diabetic range): 7 % (ref 0.0–7.0)

## 2020-11-01 LAB — GLUCOSE, POCT (MANUAL RESULT ENTRY): POC Glucose: 96 mg/dl (ref 70–99)

## 2020-11-01 MED ORDER — VALACYCLOVIR HCL 500 MG PO TABS
500.0000 mg | ORAL_TABLET | Freq: Two times a day (BID) | ORAL | 0 refills | Status: AC
  Filled 2020-11-01: qty 6, 3d supply, fill #0

## 2020-11-01 MED ORDER — AMLODIPINE BESYLATE 5 MG PO TABS
5.0000 mg | ORAL_TABLET | Freq: Every day | ORAL | 3 refills | Status: AC
  Filled 2020-11-01: qty 90, 90d supply, fill #0

## 2020-11-01 MED ORDER — SULFAMETHOXAZOLE-TRIMETHOPRIM 800-160 MG PO TABS
1.0000 | ORAL_TABLET | Freq: Two times a day (BID) | ORAL | 0 refills | Status: AC
  Filled 2020-11-01: qty 14, 7d supply, fill #0

## 2020-11-01 MED ORDER — INSULIN ASPART 100 UNIT/ML FLEXPEN
PEN_INJECTOR | SUBCUTANEOUS | 6 refills | Status: AC
  Filled 2020-11-01: qty 15, fill #0

## 2020-11-01 MED ORDER — LOSARTAN POTASSIUM 100 MG PO TABS
100.0000 mg | ORAL_TABLET | Freq: Every day | ORAL | 3 refills | Status: AC
  Filled 2020-11-01: qty 90, 90d supply, fill #0

## 2020-11-01 NOTE — Progress Notes (Signed)
Patient ID: Chelsea Norton, female    DOB: April 14, 1950  MRN: [id number]  CC: Hypertension and Diabetes   Subjective: Chelsea Norton is a 70 y.o. female who presents for chronic ds management Her concerns today include:  Patient with history of Covid, DVT LLE associated with COVID, DM type II, HL, OHS, OSA, obesity, CHF with EF  55-60% 03/2020, HTN, ascending thoracic aortic aneurysm (last imaging 07/2020), OA LT knee  She did not bring her medicines with her today.  I did received her records from previous PCP. I went over her c-scope report.  Pt states she was told she will need repeat in 5 yrs from when done 11/2015.  She still has not been strict with her Medicare/Medicaid from [place].  She thinks she may have been approved for cone discount at 50%.  She will find out from her daughter.  She did not have the mammogram done as ordered on last visit.  Patient states she did receive a letter from what I think is the [facility] program.  States that there is a form in it that she needs to fill out and send back but she has not done that as yet.  HYPERTENSION/CHF Currently taking: see medication list.  She has seen the clinical pharmacist a few times since last visit.  Med Adherence: [x]  Yes but out Norvasc x 1 wk and Cozaar x 3 days.  Other medications that she is on are furosemide and carvedilol. Medication side effects: []  Yes    [x]  No Adherence with salt restriction: [x]  Yes    []  No Home Monitoring?: []  Yes    [x]  No.  Plans to get new device Monitoring Frequency:  Home BP results range:  SOB? []  Yes    [x]  No Chest Pain?: []  Yes    [x]  No Leg swelling?: []  Yes    [x]  No Headaches?: [x]  Yes - dull headache over eyes or bridge of nose.  Associated with drainage at back of throat Dizziness? []  Yes    [x]  No Comments:   DM:  Results for orders placed or performed in visit on 11/01/20  POCT glucose (manual entry)  Result Value Ref Range   POC Glucose 96 70 - 99 mg/dl  POCT  glycosylated hemoglobin (Hb A1C)  Result Value Ref Range   Hemoglobin A1C     HbA1c POC (<> result, manual entry)     HbA1c, POC (prediabetic range)     HbA1c, POC (controlled diabetic range) 7.0 0.0 - 7.0 %   compliant with Levemir 10 units in a.m.  Taking Novolog only when BS over 180.  Prescription was written as 4 units with meals.  She is checking BS 4-6 Has log.  Before BF 112-150, 2 hrs post [id number], before dinner 83-160, after dinner 140-207.  Reports occasional hypoglycemia which she can feel. -Doing good with eating habits  Reports she has recurrent rash on LT buttock x 3 days.  Similar to what she had in January of this year after she was discharged from the hospital.  The rash is red and very itchy.   No initiating factors Using Detrol solution and some anti-itch cream OTC. In past had use Bactracin and it resolved Patient Active Problem List   Diagnosis Date Noted   Thoracic aortic aneurysm without rupture ([place]) 07/19/2020   Chronic heart failure with preserved ejection fraction ([place]) 07/19/2020   Morbid obesity ([place]) 07/19/2020   Essential hypertension 05/31/2020   Primary osteoarthritis of left knee 05/31/2020  Acute deep vein thrombosis (DVT) of left lower extremity ([place]) 05/01/2020   Obesity, diabetes, and hypertension syndrome ([place]) 05/01/2020   OSA (obstructive sleep apnea) 05/01/2020   Respiratory failure ([place]) 04/19/2020     Current Outpatient Medications on File Prior to Visit  Medication Sig Dispense Refill   albuterol (VENTOLIN HFA) 108 (90 Base) MCG/ACT inhaler Inhale 1-2 puffs into the lungs every 6 (six) hours as needed for wheezing or shortness of breath. 8 g 6   amLODipine (NORVASC) 5 MG tablet Take 1 tablet (5 mg total) by mouth daily. 90 tablet 0   Ascorbic Acid (VITAMIN C) 1000 MG tablet Take 1,000 mg by mouth daily.     atorvastatin (LIPITOR) 40 MG tablet Take 1 tablet (40 mg total) by mouth daily. 30 tablet 6   b complex vitamins capsule Take 1  capsule by mouth daily.     blood glucose meter kit and supplies KIT Dispense based on patient and insurance preference. Use up to four times daily as directed. (FOR ICD-9 250.00, 250.01). 1 each 0   Blood Glucose Monitoring Suppl (TRUE METRIX METER) w/Device KIT USE AS DIRECTED 1 kit 0   carvedilol (COREG) 3.125 MG tablet Take 1 tablet (3.125 mg total) by mouth 2 (two) times daily with a meal. 60 tablet 6   diclofenac Sodium (VOLTAREN) 1 % GEL Apply 2 g topically 4 (four) times daily. 100 g 2   furosemide (LASIX) 20 MG tablet Take 1 tablet (20 mg total) by mouth daily. 30 tablet 5   glucose blood (TRUE METRIX BLOOD GLUCOSE TEST) test strip Use as instructed 100 each 12   insulin aspart (NOVOLOG) 100 UNIT/ML FlexPen Inject 4 Units into the skin 3 (three) times daily with meals. 15 mL 6   insulin detemir (LEVEMIR) 100 UNIT/ML FlexPen Inject 10 Units into the skin daily. 15 mL 6   Insulin Pen Needle 31G X 5 MM MISC use as directed to inject insulin four times a day 100 each 6   Insulin Pen Needle 32G X 4 MM MISC USE AS DIRECTED 4 TIMES DAILY (Patient taking differently: 4 (four) times daily. use as directed) 100 each 0   losartan (COZAAR) 100 MG tablet Take 1 tablet (100 mg total) by mouth daily. 90 tablet 1   Omega-3 Fatty Acids (FISH OIL) 1000 MG CAPS Take 1,000 mg by mouth daily.     TRUEplus Lancets 28G MISC Use as directed 100 each 4   Turmeric 500 MG CAPS Take 500 mg by mouth daily.     Vitamin D, Cholecalciferol, 25 MCG (1000 UT) TABS Take 1,000 Units by mouth daily.     Zinc 50 MG CAPS Take 50 mg by mouth daily.     No current facility-administered medications on file prior to visit.    No Known Allergies  Social History   Socioeconomic History   Marital status: Single    Spouse name: Not on file   Number of children: Not on file   Years of education: Not on file   Highest education level: Not on file  Occupational History   Not on file  Tobacco Use   Smoking status: Never    Smokeless tobacco: Never  Substance and Sexual Activity   Alcohol use: Never   Drug use: Never   Sexual activity: Not Currently  Other Topics Concern   Not on file  Social History Narrative   Not on file   Social Determinants of Health   Financial Resource Strain:  Not on file  Food Insecurity: Not on file  Transportation Needs: Not on file  Physical Activity: Not on file  Stress: Not on file  Social Connections: Not on file  Intimate Partner Violence: Not on file    No family history on file.  No past surgical history on file.  ROS: Review of Systems Negative except as stated above  PHYSICAL EXAM: BP (!) 164/75   Pulse 74   Resp 16   Wt 247 lb 3.2 oz (112.1 kg)   SpO2 94%   BMI 42.43 kg/m   Wt Readings from Last 3 Encounters:  11/01/20 247 lb 3.2 oz (112.1 kg)  07/31/20 248 lb 3.2 oz (112.6 kg)  07/19/20 247 lb (112 kg)    Physical Exam  General appearance - alert, well appearing, obese [place] female and in no distress.  She ambulates with a cane. Mental status -she is a little forgetful. Neck - supple, no significant adenopathy Chest - clear to auscultation, no wheezes, rales or rhonchi, symmetric air entry Heart - normal rate, regular rhythm, normal S1, S2, no murmurs, rubs, clicks or gallops Extremities - peripheral pulses normal, no pedal edema, no clubbing or cyanosis Skin -she has a 3 x 4 cm erythematous area on the left buttock.  There are several small cream-colored vesicles in the center of this area.   CMP Latest Ref Rng & Units 07/26/2020 05/31/2020 04/25/2020  Glucose 65 - 99 mg/dL 139(H) 126(H) 72  BUN 8 - 27 mg/dL 14 13 28(H)  Creatinine 0.57 - 1.00 mg/dL 0.98 1.00 1.17(H)  Sodium 134 - 144 mmol/L 144 146(H) 144  Potassium 3.5 - 5.2 mmol/L 4.1 4.3 3.5  Chloride 96 - 106 mmol/L 104 104 102  CO2 20 - 29 mmol/L 26 27 34(H)  Calcium 8.7 - 10.3 mg/dL 9.7 9.9 9.1  Total Protein 6.5 - 8.1 g/dL - - 5.9(L)  Total Bilirubin 0.3 - 1.2 mg/dL - -  0.6  Alkaline Phos 38 - 126 U/L - - 44  AST 15 - 41 U/L - - 19  ALT 0 - 44 U/L - - 22   Lipid Panel     Component Value Date/Time   CHOL 136 05/31/2020 1201   TRIG 92 05/31/2020 1201   HDL 47 05/31/2020 1201   CHOLHDL 2.9 05/31/2020 1201   LDLCALC 72 05/31/2020 1201    CBC    Component Value Date/Time   WBC 8.0 05/31/2020 1201   WBC 7.1 04/25/2020 0320   RBC 5.41 (H) 05/31/2020 1201   RBC 5.20 (H) 04/25/2020 0320   HGB 12.2 05/31/2020 1201   HCT 40.9 05/31/2020 1201   PLT 241 05/31/2020 1201   MCV 76 (L) 05/31/2020 1201   MCH 22.6 (L) 05/31/2020 1201   MCH 22.7 (L) 04/25/2020 0320   MCHC 29.8 (L) 05/31/2020 1201   MCHC 30.0 04/25/2020 0320   RDW 15.8 (H) 05/31/2020 1201   LYMPHSABS 1.8 04/25/2020 0320   MONOABS 0.7 04/25/2020 0320   EOSABS 0.2 04/25/2020 0320   BASOSABS 0.0 04/25/2020 0320    ASSESSMENT AND PLAN: 1. Type 2 diabetes mellitus with morbid obesity (HCC) A1c has improved from 7.7 on last visit now to 7.  Commended her on this.  Encouraged her to continue healthy eating habits.  Continue current dose of Levemir 10 units daily.  Change NovoLog to 2 units with the 2 largest meals of the day.  Continue to monitor blood sugars.  Went over management of hypoglycemia. - POCT  glucose (manual entry) - POCT glycosylated hemoglobin (Hb A1C) - insulin aspart (NOVOLOG) 100 UNIT/ML FlexPen; Inject 2 units into the skin with the 2 larges meals of the day.  Dispense: 15 mL; Refill: 6  2. Hypertension associated with diabetes ([place]) Not at goal.  She has been out of amlodipine and Cozaar.  Refills sent on these medicines.  She will continue the furosemide and Coreg. - amLODipine (NORVASC) 5 MG tablet; Take 1 tablet (5 mg total) by mouth daily.  Dispense: 90 tablet; Refill: 3 - losartan (COZAAR) 100 MG tablet; Take 1 tablet (100 mg total) by mouth daily.  Dispense: 90 tablet; Refill: 3  3. Chronic diastolic congestive heart failure (HCC) Compensated.  Continue furosemide  and current blood pressure medicines.  4. Herpes simplex This rash does not look like shingles but more like herpes simplex type rash.  She gets history of similar rash back in January of this year.  I will go ahead and put her on some Valtrex.  I have also added Bactrim to treat any superimposed bacterial infection as the area is intensely red. - valACYclovir (VALTREX) 500 MG tablet; Take 1 tablet (500 mg total) by mouth 2 (two) times daily.  Dispense: 6 tablet; Refill: 0  5. Encourage patient to fill out the form that she receive from [facility] so that she can get her mammogram done. She will be due for repeat colonoscopy next month.  Addendum:  I note that on CTA of the chest that she had done in May of this year there was an incidental finding of small lung nodules.  She was referred by the cardiologist to the pulmonary specialist but looks like she canceled the appointment.  I did not get to address this with her today but will do so on subsequent visit.  She is not a smoker and is considered low risk.  Patient was given the opportunity to ask questions.  Patient verbalized understanding of the plan and was able to repeat key elements of the plan.   Orders Placed This Encounter  Procedures   POCT glucose (manual entry)   POCT glycosylated hemoglobin (Hb A1C)     Requested Prescriptions    No prescriptions requested or ordered in this encounter    No follow-ups on file.  Karle Plumber, MD, FACP

## 2021-08-26 NOTE — Addendum Note (Signed)
Encounter addended by: Novella Olive on: 08/26/2021 1:18 PM  Actions taken: Letter saved

## 2021-12-03 IMAGING — CT CT ANGIO CHEST
3 of 8 series · 18 of 46 positions shown · IV contrast (omnipaque)
Comparison: None.

CLINICAL DATA: Thoracic aortic aneurysm

EXAM:
CT ANGIOGRAPHY CHEST WITH CONTRAST
TECHNIQUE: Multidetector CT imaging of the chest was performed using the
standard protocol during bolus administration of intravenous
contrast. Multiplanar CT image reconstructions and MIPs were
obtained to evaluate the vascular anatomy.
CONTRAST:  100mL OMNIPAQUE IOHEXOL 350 MG/ML SOLN

[Series 4: aorta 3.0 bf37 2 · axial · 0.71mm/px · z∈[-326,-68]mm · 13 of 102 slices shown]
[im 8/102  lung]
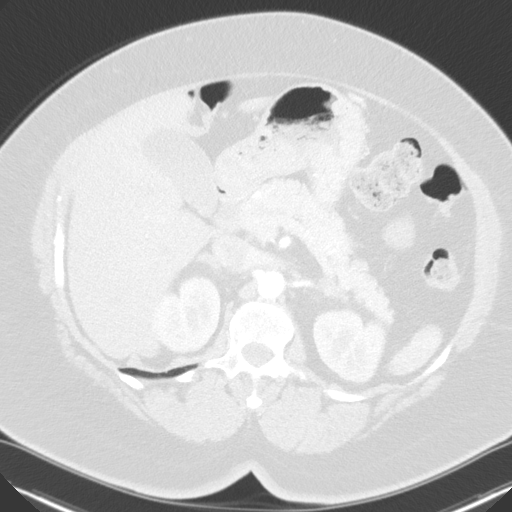
[im 15/102  soft-tissue]
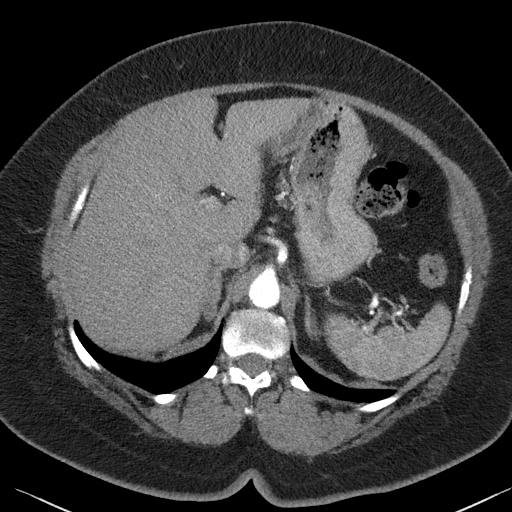
[im 22/102  lung]
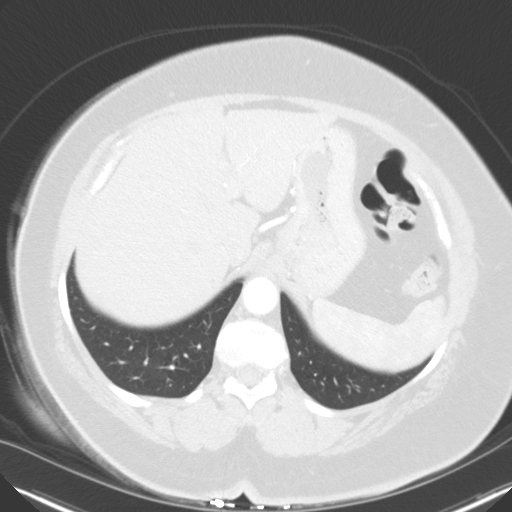
[im 29/102  soft-tissue]
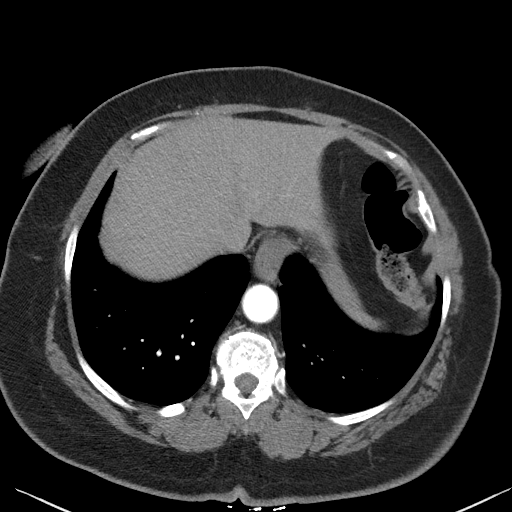
[im 37/102  lung]
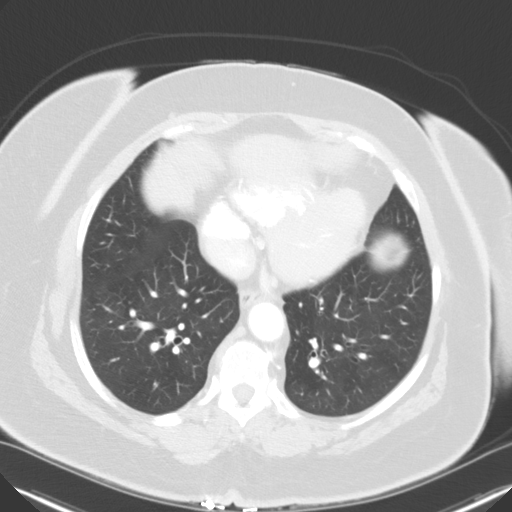
[im 44/102  soft-tissue]
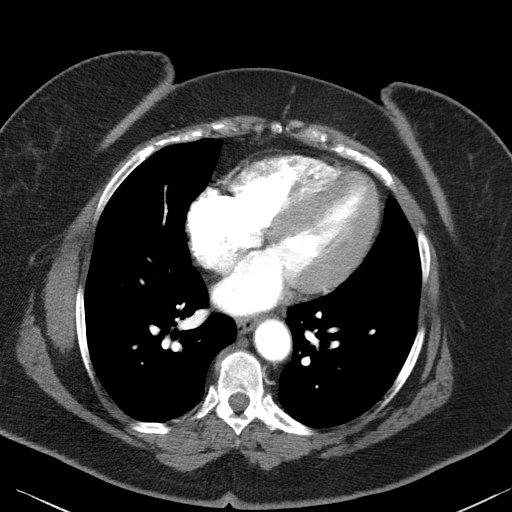
[im 51/102  lung]
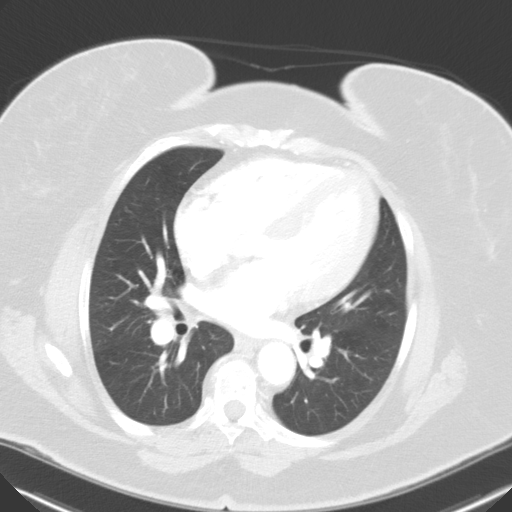
[im 58/102  soft-tissue]
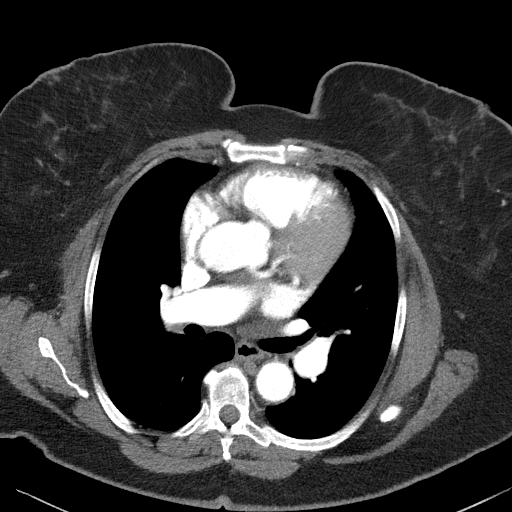
[im 65/102  lung]
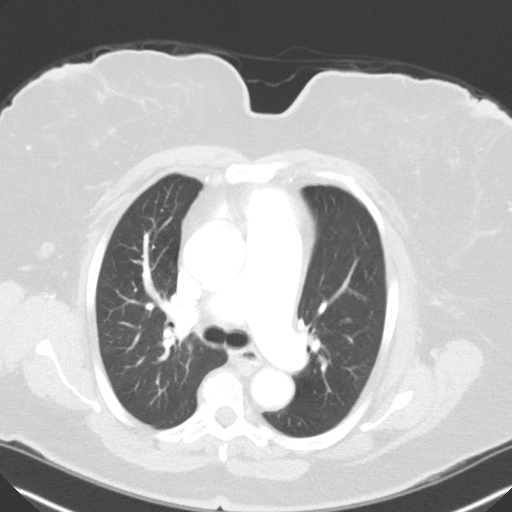
[im 73/102  soft-tissue]
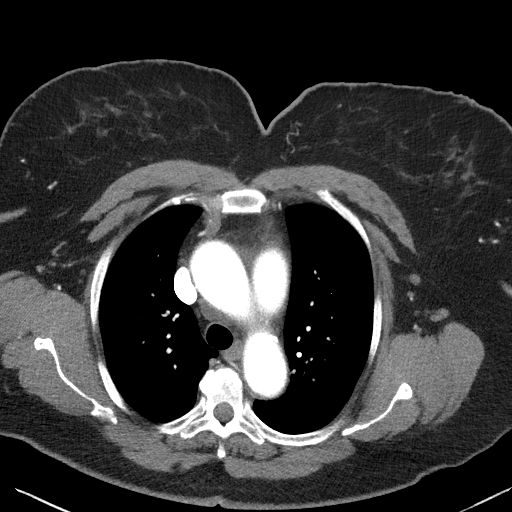
[im 80/102  lung]
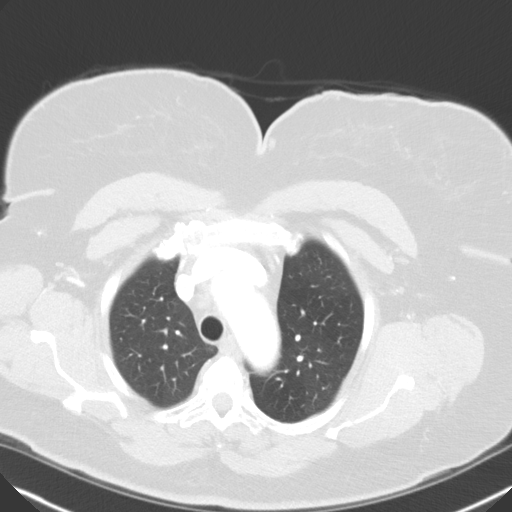
[im 87/102  soft-tissue]
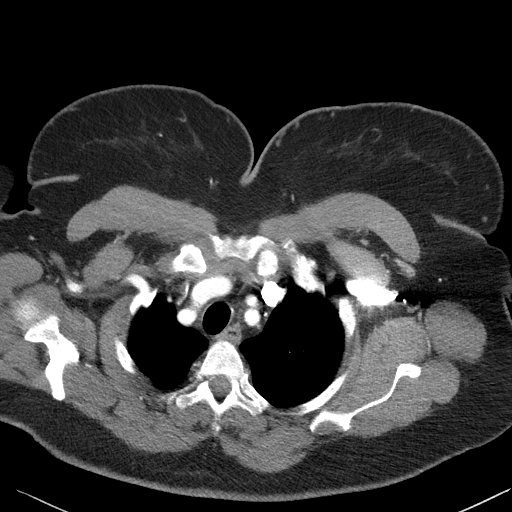
[im 94/102  lung]
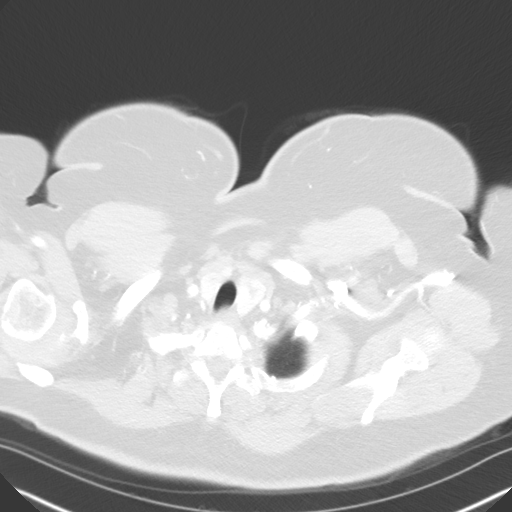

[Series 5: lung · axial · 0.71mm/px · z∈[-326,-284]mm · 2 of 102 slices shown]
[im 8/102  soft-tissue]
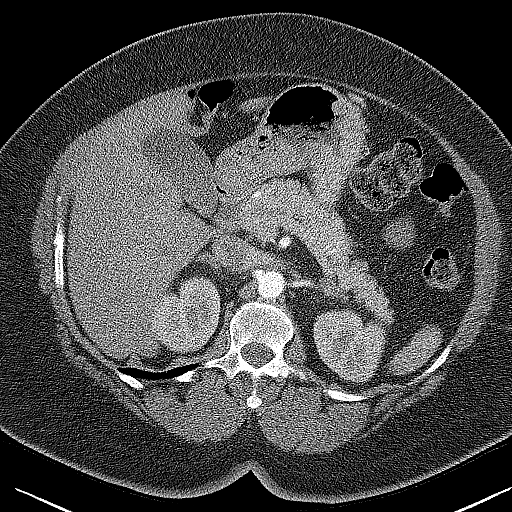
[im 22/102  soft-tissue]
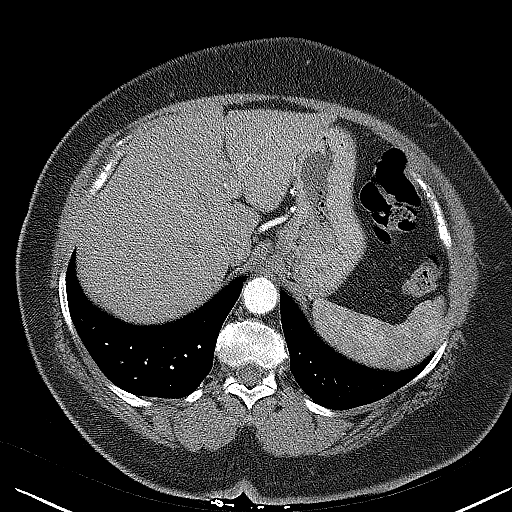

[Series 7: coronals · coronal · 0.62mm/px · 3 of 128 slices shown]
[im 32/128  soft-tissue]
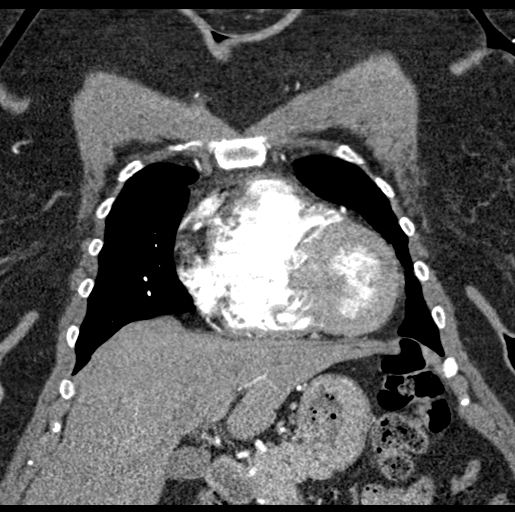
[im 64/128  soft-tissue]
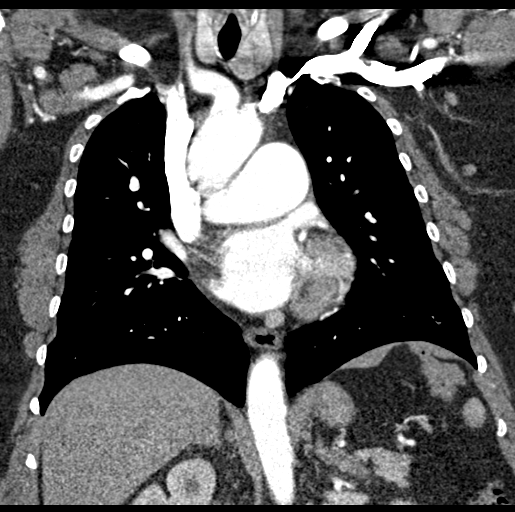
[im 96/128  soft-tissue]
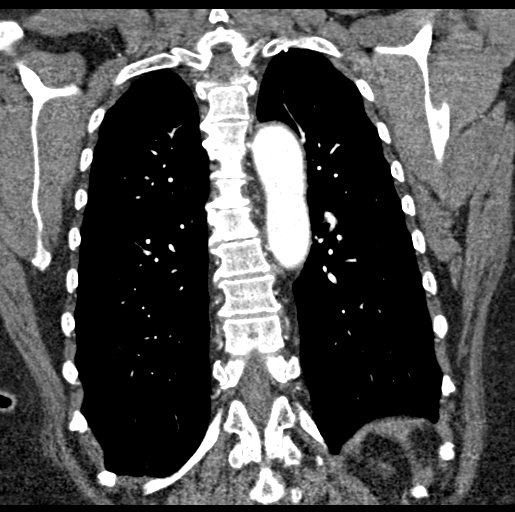

[18 of 46 positions shown; findings below may reference images not displayed]

FINDINGS: Cardiovascular: Conventional 3 vessel arch anatomy. Mild fusiform
aneurysmal dilation of the ascending thoracic aorta at 4 cm. The
aortic root is normal in caliber. The main pulmonary artery is
enlarged at 4 cm in diameter. The heart is normal in size. No
pericardial effusion.

Mediastinum/Nodes: Unremarkable CT appearance of the thyroid gland.
No suspicious mediastinal or hilar adenopathy. No soft tissue
mediastinal mass. The thoracic esophagus is unremarkable.

Lungs/Pleura: Lungs are clear. No pleural effusion or pneumothorax.
Several tiny 2-3 mm subpleural pulmonary nodules, almost certainly
benign.

Upper Abdomen: No acute abnormality.

Musculoskeletal: No chest wall abnormality. No acute or significant
osseous findings.

Review of the MIP images confirms the above findings.
IMPRESSION: 1. Mild fusiform aneurysmal dilation of the ascending thoracic aorta
with a maximal transverse diameter of 4 cm. Recommend annual imaging
followup by CTA or MRA. This recommendation follows 9010
ACCF/AHA/AATS/ACR/ASA/SCA/AMIYAH/JIMBOY/AWAY/BILLIOT Guidelines for the
Diagnosis and Management of Patients with Thoracic Aortic Disease.
Circulation. 9010; 121: [id number]. Aortic aneurysm NOS (LOY69-DI0.G)
2. Enlarged main pulmonary artery also at 4 cm. This suggests the
possibility of underlying chronic pulmonary hypertension.
3. Several tiny 2-3 mm subpleural pulmonary nodules are noted
incidentally. No follow-up needed if patient is low-risk (and has no
known or suspected primary neoplasm). Non-contrast chest CT can be
considered in 12 months if patient is high-risk. This recommendation
follows the consensus statement: Guidelines for Management of
Incidental Pulmonary Nodules Detected on CT Images: From the

Aortic aneurysm NOS (LOY69-DI0.G).

## 2022-03-18 ENCOUNTER — Other Ambulatory Visit: Payer: Self-pay
